# Patient Record
Sex: Male | Born: 1985 | Race: Black or African American | Hispanic: No | Marital: Single | Smoking: Current every day smoker
Health system: Southern US, Community
[De-identification: ages and names within clinical notes are randomized; demographics above are authoritative.]

## PROBLEM LIST (undated history)

## (undated) DIAGNOSIS — F32A Depression, unspecified: Secondary | ICD-10-CM

## (undated) DIAGNOSIS — F329 Major depressive disorder, single episode, unspecified: Secondary | ICD-10-CM

## (undated) DIAGNOSIS — F419 Anxiety disorder, unspecified: Secondary | ICD-10-CM

## (undated) DIAGNOSIS — E785 Hyperlipidemia, unspecified: Secondary | ICD-10-CM

## (undated) DIAGNOSIS — I10 Essential (primary) hypertension: Secondary | ICD-10-CM

## (undated) DIAGNOSIS — K219 Gastro-esophageal reflux disease without esophagitis: Secondary | ICD-10-CM

## (undated) HISTORY — DX: Depression, unspecified: F32.A

## (undated) HISTORY — DX: Major depressive disorder, single episode, unspecified: F32.9

## (undated) HISTORY — DX: Anxiety disorder, unspecified: F41.9

## (undated) HISTORY — DX: Hyperlipidemia, unspecified: E78.5

## (undated) HISTORY — DX: Essential (primary) hypertension: I10

---

## 2004-07-02 ENCOUNTER — Emergency Department (HOSPITAL_COMMUNITY): Admission: EM | Admit: 2004-07-02 | Discharge: 2004-07-02 | Payer: Self-pay | Admitting: Emergency Medicine

## 2004-07-05 ENCOUNTER — Emergency Department (HOSPITAL_COMMUNITY): Admission: EM | Admit: 2004-07-05 | Discharge: 2004-07-05 | Payer: Self-pay | Admitting: Family Medicine

## 2006-07-09 ENCOUNTER — Emergency Department (HOSPITAL_COMMUNITY): Admission: EM | Admit: 2006-07-09 | Discharge: 2006-07-09 | Payer: Self-pay | Admitting: Family Medicine

## 2006-07-13 ENCOUNTER — Emergency Department (HOSPITAL_COMMUNITY): Admission: EM | Admit: 2006-07-13 | Discharge: 2006-07-13 | Payer: Self-pay | Admitting: Family Medicine

## 2006-07-28 ENCOUNTER — Emergency Department (HOSPITAL_COMMUNITY): Admission: EM | Admit: 2006-07-28 | Discharge: 2006-07-28 | Payer: Self-pay | Admitting: Emergency Medicine

## 2006-07-31 ENCOUNTER — Emergency Department (HOSPITAL_COMMUNITY): Admission: EM | Admit: 2006-07-31 | Discharge: 2006-07-31 | Payer: Self-pay | Admitting: Family Medicine

## 2014-07-25 ENCOUNTER — Ambulatory Visit (INDEPENDENT_AMBULATORY_CARE_PROVIDER_SITE_OTHER): Payer: Self-pay | Admitting: Physician Assistant

## 2014-07-25 VITALS — BP 134/70 | HR 109 | Temp 100.6°F | Resp 18 | Ht 74.0 in | Wt 213.0 lb

## 2014-07-25 DIAGNOSIS — R509 Fever, unspecified: Secondary | ICD-10-CM

## 2014-07-25 DIAGNOSIS — J069 Acute upper respiratory infection, unspecified: Secondary | ICD-10-CM

## 2014-07-25 LAB — POCT INFLUENZA A/B
INFLUENZA B, POC: NEGATIVE
Influenza A, POC: NEGATIVE

## 2014-07-25 MED ORDER — IBUPROFEN 600 MG PO TABS: 600.0000 mg | ORAL_TABLET | Freq: Three times a day (TID) | ORAL | Status: DC | PRN

## 2014-07-25 MED ORDER — BENZONATATE 100 MG PO CAPS: 100.0000 mg | ORAL_CAPSULE | Freq: Three times a day (TID) | ORAL | Status: DC | PRN

## 2014-07-25 MED ORDER — IBUPROFEN 200 MG PO TABS
400.0000 mg | ORAL_TABLET | Freq: Once | ORAL | Status: AC
  Administered 2014-07-25: 400 mg via ORAL

## 2014-07-25 NOTE — Progress Notes (Signed)
Subjective:    Patient ID: Calvin Robinson, male    DOB: [birth year], 29 y.o.   MRN: [id number]  HPI  Patient presents with fever, chills, myalgias, diarrhea, and cough that started this morning when he woke up. Has additionally been sweating, has decreased appetite, HA, sinus/ear pressure, and nausea. Had 5-6 loose stools. Sore throat is apparent with cough. Took Advil for HA this morning without relief. Denies h/o asthma or allergies. Smokes 1/2 pack/day. Had coworker that was sent home for being sick. NKDA.   Review of Systems  Constitutional: Positive for fever, chills, diaphoresis, appetite change and fatigue. Negative for activity change.  HENT: Positive for ear pain, rhinorrhea, sinus pressure and sore throat. Negative for congestion, ear discharge, postnasal drip and sneezing.   Eyes: Negative.   Respiratory: Positive for cough. Negative for chest tightness, shortness of breath and wheezing.   Cardiovascular: Negative for chest pain.  Gastrointestinal: Positive for nausea, abdominal pain and diarrhea. Negative for vomiting.  Musculoskeletal: Positive for myalgias.  Allergic/Immunologic: Negative for environmental allergies and food allergies.  Neurological: Positive for headaches. Negative for dizziness.  Hematological: Negative for adenopathy.       Objective:   Physical Exam  Constitutional: He is oriented to person, place, and time. He appears well-developed and well-nourished. He appears lethargic. He has a sickly appearance.  Blood pressure 134/70, pulse 109, temperature 100.6 F (38.1 C), temperature source Oral, resp. rate 18, height 6\' 2"  (1.88 m), weight 213 lb (96.616 kg), SpO2 100 %.  HENT:  Head: Normocephalic and atraumatic.  Right Ear: Tympanic membrane, external ear and ear canal normal.  Left Ear: Tympanic membrane, external ear and ear canal normal.  Nose: Rhinorrhea (with moderate erythema) present. No mucosal edema or sinus tenderness. Right sinus exhibits no  maxillary sinus tenderness and no frontal sinus tenderness. Left sinus exhibits no maxillary sinus tenderness and no frontal sinus tenderness.  Mouth/Throat: Uvula is midline and mucous membranes are normal. Posterior oropharyngeal erythema present. No oropharyngeal exudate, posterior oropharyngeal edema or tonsillar abscesses.  2+ hypertrophic tonsils bilat. Minimal erythema. No exudate.  Eyes: Conjunctivae are normal. Pupils are equal, round, and reactive to light. Right eye exhibits no discharge. Left eye exhibits no discharge. No scleral icterus.  Neck: Normal range of motion. Neck supple. No thyromegaly present.  Cardiovascular: Normal rate, regular rhythm and normal heart sounds.  Exam reveals no gallop and no friction rub.   No murmur heard. Pulmonary/Chest: Effort normal and breath sounds normal. No respiratory distress. He has no decreased breath sounds. He has no wheezes. He has no rhonchi. He has no rales.  Abdominal: Soft. Bowel sounds are normal. There is no tenderness.  Lymphadenopathy:    He has no cervical adenopathy.  Neurological: He is oriented to person, place, and time. He appears lethargic.  Skin: Skin is warm and dry. No rash noted. He is not diaphoretic. No erythema. No pallor.   Results for orders placed or performed in visit on 07/25/14  POCT Influenza A/B  Result Value Ref Range   Influenza A, POC Negative    Influenza B, POC Negative       Assessment & Plan:  1. Fever, unspecified fever cause - In office: ibuprofen (ADVIL,MOTRIN) tablet 400 mg; Take 2 tablets (400 mg total) by mouth once.  - POCT Influenza A/B - ibuprofen (ADVIL,MOTRIN) 600 MG tablet; Take 1 tablet (600 mg total) by mouth every 8 (eight) hours as needed.  Dispense: 30 tablet; Refill: 0 for fever and  myalgias.   2. Acute upper respiratory infection RTC if sx worsen.  - benzonatate (TESSALON) 100 MG capsule; Take 1-2 capsules (100-200 mg total) by mouth 3 (three) times daily as needed for cough.   Dispense: 40 capsule; Refill: 0 - Note written to return to work 07/28/14.    Alveta Heimlich PA-C  [facility] 07/25/2014 5:22 PM

## 2014-07-25 NOTE — Patient Instructions (Addendum)
Upper Respiratory Infection, Adult An upper respiratory infection (URI) is also sometimes known as the common cold. The upper respiratory tract includes the nose, sinuses, throat, trachea, and bronchi. Bronchi are the airways leading to the lungs. Most people improve within 1 week, but symptoms can last up to 2 weeks. A residual cough may last even longer.  CAUSES Many different viruses can infect the tissues lining the upper respiratory tract. The tissues become irritated and inflamed and often become very moist. Mucus production is also common. A cold is contagious. You can easily spread the virus to others by oral contact. This includes kissing, sharing a glass, coughing, or sneezing. Touching your mouth or nose and then touching a surface, which is then touched by another person, can also spread the virus. SYMPTOMS  Symptoms typically develop 1 to 3 days after you come in contact with a cold virus. Symptoms vary from person to person. They may include:  Runny nose.  Sneezing.  Nasal congestion.  Sinus irritation.  Sore throat.  Loss of voice (laryngitis).  Cough.  Fatigue.  Muscle aches.  Loss of appetite.  Headache.  Low-grade fever. DIAGNOSIS  You might diagnose your own cold based on familiar symptoms, since most people get a cold 2 to 3 times a year. Your caregiver can confirm this based on your exam. Most importantly, your caregiver can check that your symptoms are not due to another disease such as strep throat, sinusitis, pneumonia, asthma, or epiglottitis. Blood tests, throat tests, and X-rays are not necessary to diagnose a common cold, but they may sometimes be helpful in excluding other more serious diseases. Your caregiver will decide if any further tests are required. RISKS AND COMPLICATIONS  You may be at risk for a more severe case of the common cold if you smoke cigarettes, have chronic heart disease (such as heart failure) or lung disease (such as asthma), or if  you have a weakened immune system. The very young and very old are also at risk for more serious infections. Bacterial sinusitis, middle ear infections, and bacterial pneumonia can complicate the common cold. The common cold can worsen asthma and chronic obstructive pulmonary disease (COPD). Sometimes, these complications can require emergency medical care and may be life-threatening. PREVENTION  The best way to protect against getting a cold is to practice good hygiene. Avoid oral or hand contact with people with cold symptoms. Wash your hands often if contact occurs. There is no clear evidence that vitamin C, vitamin E, echinacea, or exercise reduces the chance of developing a cold. However, it is always recommended to get plenty of rest and practice good nutrition. TREATMENT  Treatment is directed at relieving symptoms. There is no cure. Antibiotics are not effective, because the infection is caused by a virus, not by bacteria. Treatment may include:  Increased fluid intake. Sports drinks offer valuable electrolytes, sugars, and fluids.  Breathing heated mist or steam (vaporizer or shower).  Eating chicken soup or other clear broths, and maintaining good nutrition.  Getting plenty of rest.  Using gargles or lozenges for comfort.  Controlling fevers with ibuprofen or acetaminophen as directed by your caregiver.  Increasing usage of your inhaler if you have asthma. Zinc gel and zinc lozenges, taken in the first 24 hours of the common cold, can shorten the duration and lessen the severity of symptoms. Pain medicines may help with fever, muscle aches, and throat pain. A variety of non-prescription medicines are available to treat congestion and runny nose. Your caregiver   can make recommendations and may suggest nasal or lung inhalers for other symptoms.  HOME CARE INSTRUCTIONS   Only take over-the-counter or prescription medicines for pain, discomfort, or fever as directed by your  caregiver.  Use a warm mist humidifier or inhale steam from a shower to increase air moisture. This may keep secretions moist and make it easier to breathe.  Drink enough water and fluids to keep your urine clear or pale yellow.  Rest as needed.  Return to work when your temperature has returned to normal or as your caregiver advises. You may need to stay home longer to avoid infecting others. You can also use a face mask and careful hand washing to prevent spread of the virus. SEEK MEDICAL CARE IF:   After the first few days, you feel you are getting worse rather than better.  You need your caregiver's advice about medicines to control symptoms.  You develop chills, worsening shortness of breath, or brown or red sputum. These may be signs of pneumonia.  You develop yellow or brown nasal discharge or pain in the face, especially when you bend forward. These may be signs of sinusitis.  You develop a fever, swollen neck glands, pain with swallowing, or white areas in the back of your throat. These may be signs of strep throat. SEEK IMMEDIATE MEDICAL CARE IF:   You have a fever.  You develop severe or persistent headache, ear pain, sinus pain, or chest pain.  You develop wheezing, a prolonged cough, cough up blood, or have a change in your usual mucus (if you have chronic lung disease).  You develop sore muscles or a stiff neck. Document Released: 11/23/2000 Document Revised: 08/22/2011 Document Reviewed: 09/04/2013 [employer] Patient Information 2015 [employer]. This information is not intended to replace advice given to you by your health care provider. Make sure you discuss any questions you have with your health care provider.  

## 2014-08-05 ENCOUNTER — Emergency Department (HOSPITAL_COMMUNITY)
Admission: EM | Admit: 2014-08-05 | Discharge: 2014-08-05 | Disposition: A | Discharge status: Home / Self Care | Payer: Self-pay | Attending: Emergency Medicine | Admitting: Emergency Medicine

## 2014-08-05 ENCOUNTER — Encounter (HOSPITAL_COMMUNITY): Payer: Self-pay | Admitting: Neurology

## 2014-08-05 ENCOUNTER — Emergency Department (HOSPITAL_COMMUNITY): Payer: Self-pay

## 2014-08-05 DIAGNOSIS — K219 Gastro-esophageal reflux disease without esophagitis: Secondary | ICD-10-CM | POA: Insufficient documentation

## 2014-08-05 DIAGNOSIS — Z720 Tobacco use: Secondary | ICD-10-CM | POA: Insufficient documentation

## 2014-08-05 DIAGNOSIS — Z79899 Other long term (current) drug therapy: Secondary | ICD-10-CM | POA: Insufficient documentation

## 2014-08-05 DIAGNOSIS — R079 Chest pain, unspecified: Secondary | ICD-10-CM | POA: Insufficient documentation

## 2014-08-05 DIAGNOSIS — I10 Essential (primary) hypertension: Secondary | ICD-10-CM | POA: Insufficient documentation

## 2014-08-05 HISTORY — DX: Gastro-esophageal reflux disease without esophagitis: K21.9

## 2014-08-05 LAB — BASIC METABOLIC PANEL
ANION GAP: 7 (ref 5–15)
BUN: 7 mg/dL (ref 6–23)
CO2: 28 mmol/L (ref 19–32)
Calcium: 9.5 mg/dL (ref 8.4–10.5)
Chloride: 106 mmol/L (ref 96–112)
Creatinine, Ser: 0.93 mg/dL (ref 0.50–1.35)
GFR calc Af Amer: >90 mL/min (ref >90)
GLUCOSE: 111 mg/dL — AB (ref 70–99)
POTASSIUM: 3.5 mmol/L (ref 3.5–5.1)
SODIUM: 141 mmol/L (ref 135–145)

## 2014-08-05 LAB — I-STAT TROPONIN, ED: TROPONIN I, POC: 0 ng/mL (ref 0.00–0.08)

## 2014-08-05 LAB — CBC
HEMATOCRIT: 45.9 % (ref 39.0–52.0)
Hemoglobin: 15.5 g/dL (ref 13.0–17.0)
MCH: 28.7 pg (ref 26.0–34.0)
MCHC: 33.8 g/dL (ref 30.0–36.0)
MCV: 85 fL (ref 78.0–100.0)
PLATELETS: 348 10*3/uL (ref 150–400)
RBC: 5.4 MIL/uL (ref 4.22–5.81)
RDW: 13.3 % (ref 11.5–15.5)
WBC: 6.8 10*3/uL (ref 4.0–10.5)

## 2014-08-05 MED ORDER — GI COCKTAIL ~~LOC~~
30.0000 mL | Freq: Once | ORAL | Status: AC
  Administered 2014-08-05: 30 mL via ORAL
  Filled 2014-08-05: qty 30

## 2014-08-05 NOTE — ED Notes (Signed)
Pt st's he was dx with bronchitis last week, has had cough.  Pt also st's he has acid reflux

## 2014-08-05 NOTE — ED Provider Notes (Signed)
CSN: [id number]     Arrival date & time 08/05/14  1537 History   First MD Initiated Contact with Patient 08/05/14 1633     Chief Complaint  Patient presents with  . Chest Pain     (Consider location/radiation/quality/duration/timing/severity/associated sxs/prior Treatment) HPI  Calvin Robinson is a 29 y.o. male with PMH of hypertension, acid reflux presenting with crampy mid and left-sided chest pain that is intermittent for the last week. Patient states this started after he had upper respiratory tract infection. Patient states the pain at times radiates into his left arm for the past 3 days. No nausea, vomiting, diaphoresis, shortness of breath. Patient states the pain is worse with movement as well as anxiety. Patient states is relieved with belching. Patient with history of hypertension, smoking with family history of heart attacks in their 33s. Patient without cardiac history. Pt denies history of DVT, PE, recent surgery or trauma, malignancy, hemoptysis, unilateral leg swelling or tenderness, immobilization.    Past Medical History  Diagnosis Date  . Hypertension   . GERD (gastroesophageal reflux disease)    History reviewed. No pertinent past surgical history. No family history on file. History  Substance Use Topics  . Smoking status: Current Every Day Smoker -- 0.50 packs/day    Types: Cigarettes  . Smokeless tobacco: Never Used  . Alcohol Use: 3.0 oz/week    5 Standard drinks or equivalent per week    Review of Systems 10 Systems reviewed and are negative for acute change except as noted in the HPI.    Allergies  Review of patient's allergies indicates no known allergies.  Home Medications   Prior to Admission medications   Medication Sig Start Date End Date Taking? Authorizing Provider  benzonatate (TESSALON) 100 MG capsule Take 1-2 capsules (100-200 mg total) by mouth 3 (three) times daily as needed for cough. 07/25/14   Tishira R Brewington, PA-C  esomeprazole  (NEXIUM) 40 MG capsule Take 40 mg by mouth daily at 12 noon.    Historical Provider, MD  ibuprofen (ADVIL,MOTRIN) 600 MG tablet Take 1 tablet (600 mg total) by mouth every 8 (eight) hours as needed. 07/25/14   Tishira R Brewington, PA-C  metoprolol tartrate (LOPRESSOR) 25 MG tablet Take 25 mg by mouth 2 (two) times daily.    Historical Provider, MD   BP 131/82 mmHg  Pulse 65  Temp(Src) 98.4 F (36.9 C)  Resp 20  Ht 6\' 2"  (7.90 m)  Wt 240 lb (96.616 kg)  BMI 27.34 kg/m2  SpO2 99% Physical Exam  Constitutional: He appears well-developed and well-nourished. No distress.  HENT:  Head: Normocephalic and atraumatic.  Eyes: Conjunctivae and EOM are normal. Right eye exhibits no discharge. Left eye exhibits no discharge.  Neck: No JVD present.  Cardiovascular: Normal rate and regular rhythm.   No peripheral edema, tenderness. No calf tenderness or palpable cord.  Pulmonary/Chest: Effort normal and breath sounds normal. No respiratory distress. He has no wheezes.  Abdominal: Soft. Bowel sounds are normal. He exhibits no distension. There is no tenderness.  Neurological: He is alert. He exhibits normal muscle tone. Coordination normal.  Skin: Skin is warm and dry. He is not diaphoretic.  Nursing note and vitals reviewed.   ED Course  Procedures (including critical care time) Labs Review Labs Reviewed  BASIC METABOLIC PANEL - Abnormal; Notable for the following:    Glucose, Bld 111 (*)    All other components within normal limits  CBC  I-STAT TROPOININ, ED  Imaging Review Dg Chest 2 View  08/05/2014   CLINICAL DATA:  Left-sided chest pain for 1 week.  EXAM: CHEST  2 VIEW  COMPARISON:  None.  FINDINGS: The heart size and mediastinal contours are within normal limits. Both lungs are clear. No pneumothorax or plural effusion is noted. The visualized skeletal structures are unremarkable.  IMPRESSION: No acute cardiopulmonary abnormality seen.   Electronically Signed   By: Marijo Conception,  M.D.   On: 08/05/2014 16:06     EKG Interpretation   Date/Time:  Tuesday August 05 2014 15:41:56 EST Ventricular Rate:  73 PR Interval:  140 QRS Duration: 98 QT Interval:  370 QTC Calculation: 407 R Axis:   100 Text Interpretation:  Normal sinus rhythm Rightward axis Borderline ECG  AGREE. NO STEMI Confirmed by Johnney Killian, MD, Jeannie Done [id number]) on 08/05/2014  6:10:41 PM      MDM   Final diagnoses:  Chest pain, unspecified chest pain type   Chest pain is not likely of cardiac or pulmonary etiology d/t presentation, PERC negative, low risk HEART score. VSS, no JVD or new murmur, RRR, breath sounds equal bilaterally, EKG without acute abnormalities, negative troponin and negative CXR. Pt has been advised to add zantac to his PPI and return to the ED if CP becomes exertional, associated with diaphoresis or nausea, radiates to left jaw/arm, worsens or becomes concerning in any way. Patient without a PCP. Patient to establish care and follow up with wellness center. Pt appears reliable for follow up and is agreeable to discharge.   Discussed return precautions with patient. Discussed all results and patient verbalizes understanding and agrees with plan.  Case has been discussed with Dr. Johnney Killian who agrees with the above plan and to discharge.      Pura Spice, PA-C 08/05/14 1823  Charlesetta Shanks, MD 08/06/14 [phone]

## 2014-08-05 NOTE — ED Notes (Signed)
Pt st's feels much better,  Pain has subsided.  St's he is ready to be discharged

## 2014-08-05 NOTE — Discharge Instructions (Signed)
Return to the emergency room with worsening of symptoms, new symptoms or with symptoms that are concerning , especially chest pain that feels like a pressure, spreads to left arm or jaw, worse with exertion, associated with nausea, vomiting, shortness of breath and/or sweating.  Start taking OTC zantac 150mg  daily. Please call your doctor for a followup appointment within 24-48 hours. When you talk to your doctor please let them know that you were seen in the emergency department and have them acquire all of your records so that they can discuss the findings with you and formulate a treatment plan to fully care for your new and ongoing problems. If you do not have a primary care provider please call the number above to establish care and follow up with the wellness center. Read below information and follow recommendations.   Chest Pain (Nonspecific) It is often hard to give a specific diagnosis for the cause of chest pain. There is always a chance that your pain could be related to something serious, such as a heart attack or a blood clot in the lungs. You need to follow up with your health care provider for further evaluation. CAUSES   Heartburn.  Pneumonia or bronchitis.  Anxiety or stress.  Inflammation around your heart (pericarditis) or lung (pleuritis or pleurisy).  A blood clot in the lung.  A collapsed lung (pneumothorax). It can develop suddenly on its own (spontaneous pneumothorax) or from trauma to the chest.  Shingles infection (herpes zoster virus). The chest wall is composed of bones, muscles, and cartilage. Any of these can be the source of the pain.  The bones can be bruised by injury.  The muscles or cartilage can be strained by coughing or overwork.  The cartilage can be affected by inflammation and become sore (costochondritis). DIAGNOSIS  Lab tests or other studies may be needed to find the cause of your pain. Your health care provider may have you take a test  called an ambulatory electrocardiogram (ECG). An ECG records your heartbeat patterns over a 24-hour period. You may also have other tests, such as:  Transthoracic echocardiogram (TTE). During echocardiography, sound waves are used to evaluate how blood flows through your heart.  Transesophageal echocardiogram (TEE).  Cardiac monitoring. This allows your health care provider to monitor your heart rate and rhythm in real time.  Holter monitor. This is a portable device that records your heartbeat and can help diagnose heart arrhythmias. It allows your health care provider to track your heart activity for several days, if needed.  Stress tests by exercise or by giving medicine that makes the heart beat faster. TREATMENT   Treatment depends on what may be causing your chest pain. Treatment may include:  Acid blockers for heartburn.  Anti-inflammatory medicine.  Pain medicine for inflammatory conditions.  Antibiotics if an infection is present.  You may be advised to change lifestyle habits. This includes stopping smoking and avoiding alcohol, caffeine, and chocolate.  You may be advised to keep your head raised (elevated) when sleeping. This reduces the chance of acid going backward from your stomach into your esophagus. Most of the time, nonspecific chest pain will improve within 2-3 days with rest and mild pain medicine.  HOME CARE INSTRUCTIONS   If antibiotics were prescribed, take them as directed. Finish them even if you start to feel better.  For the next few days, avoid physical activities that bring on chest pain. Continue physical activities as directed.  Do not use any tobacco products, including  cigarettes, chewing tobacco, or electronic cigarettes.  Avoid drinking alcohol.  Only take medicine as directed by your health care provider.  Follow your health care provider's suggestions for further testing if your chest pain does not go away.  Keep any follow-up  appointments you made. If you do not go to an appointment, you could develop lasting (chronic) problems with pain. If there is any problem keeping an appointment, call to reschedule. SEEK MEDICAL CARE IF:   Your chest pain does not go away, even after treatment.  You have a rash with blisters on your chest.  You have a fever. SEEK IMMEDIATE MEDICAL CARE IF:   You have increased chest pain or pain that spreads to your arm, neck, jaw, back, or abdomen.  You have shortness of breath.  You have an increasing cough, or you cough up blood.  You have severe back or abdominal pain.  You feel nauseous or vomit.  You have severe weakness.  You faint.  You have chills. This is an emergency. Do not wait to see if the pain will go away. Get medical help at once. Call your local emergency services (911 in [place] Do not drive yourself to the hospital. MAKE SURE YOU:   Understand these instructions.  Will watch your condition.  Will get help right away if you are not doing well or get worse. Document Released: 03/09/2005 Document Revised: 06/04/2013 Document Reviewed: 01/03/2008 [facility] Patient Information 2015 [place], [place]. This information is not intended to replace advice given to you by your health care provider. Make sure you discuss any questions you have with your health care provider.

## 2014-08-05 NOTE — ED Notes (Signed)
Pt reports left sided cp with radiation to left arm for 3 days. Denies n/v, denies SOB. Pt is tearful at triage. Is a x 4. Denies cardiac hx

## 2015-03-01 ENCOUNTER — Ambulatory Visit (INDEPENDENT_AMBULATORY_CARE_PROVIDER_SITE_OTHER): Payer: 59 | Admitting: Emergency Medicine

## 2015-03-01 VITALS — BP 130/82 | HR 85 | Temp 98.4°F | Resp 16 | Ht 74.0 in | Wt 209.5 lb

## 2015-03-01 DIAGNOSIS — K602 Anal fissure, unspecified: Secondary | ICD-10-CM

## 2015-03-01 MED ORDER — DOXYCYCLINE HYCLATE 100 MG PO TABS: 100.0000 mg | ORAL_TABLET | Freq: Two times a day (BID) | ORAL | Status: DC

## 2015-03-01 MED ORDER — LIDOCAINE (ANORECTAL) 5 % EX GEL
CUTANEOUS | Status: DC
Start: 2015-03-01 — End: 2017-12-07

## 2015-03-01 MED ORDER — LIDOCAINE (ANORECTAL) 5 % EX GEL: CUTANEOUS | Status: DC

## 2015-03-01 MED ORDER — DILTIAZEM GEL 2 %: 1.0000 "application " | Freq: Two times a day (BID) | CUTANEOUS | Status: DC

## 2015-03-01 NOTE — Patient Instructions (Signed)
Anal Fissure, Adult An anal fissure is a small tear or crack in the skin around the anus. Bleeding from a fissure usually stops on its own within a few minutes. However, bleeding will often reoccur with each bowel movement until the crack heals.  CAUSES   Passing large, hard stools.  Frequent diarrheal stools.  Constipation.  Inflammatory bowel disease (Crohn's disease or ulcerative colitis).  Infections.  Anal sex. SYMPTOMS   Small amounts of blood seen on your stools, on toilet paper, or in the toilet after a bowel movement.  Rectal bleeding.  Painful bowel movements.  Itching or irritation around the anus. DIAGNOSIS Your caregiver will examine the anal area. An anal fissure can usually be seen with careful inspection. A rectal exam may be performed and a short tube (anoscope) may be used to examine the anal canal. TREATMENT   You may be instructed to take fiber supplements. These supplements can soften your stool to help make bowel movements easier.  Sitz baths may be recommended to help heal the tear. Do not use soap in the sitz baths.  A medicated cream or ointment may be prescribed to lessen discomfort. HOME CARE INSTRUCTIONS   Maintain a diet high in fruits, whole grains, and vegetables. Avoid constipating foods like bananas and dairy products.  Take sitz baths as directed by your caregiver.  Drink enough fluids to keep your urine clear or pale yellow.  Only take over-the-counter or prescription medicines for pain, discomfort, or fever as directed by your caregiver. Do not take aspirin as this may increase bleeding.  Do not use ointments containing numbing medications (anesthetics) or hydrocortisone. They could slow healing. SEEK MEDICAL CARE IF:   Your fissure is not completely healed within 3 days.  You have further bleeding.  You have a fever.  You have diarrhea mixed with blood.  You have pain.  Your problem is getting worse rather than  better. MAKE SURE YOU:   Understand these instructions.  Will watch your condition.  Will get help right away if you are not doing well or get worse. Document Released: 05/30/2005 Document Revised: 08/22/2011 Document Reviewed: 11/14/2010 [employer] Patient Information 2015 [employer]. This information is not intended to replace advice given to you by your health care provider. Make sure you discuss any questions you have with your health care provider.  

## 2015-03-01 NOTE — Progress Notes (Signed)
This chart was scribed for Arlyss Queen, MD by Leandra Kern, Medical Scribe. This patient was seen in Room 11 and the patient's care was started at 1:11 PM.   Chief Complaint:  Chief Complaint  Patient presents with  . Hemorrhoids    x 2 wks.  using otc meds that have not helped.  did notice some blood that comes and goes    HPI: Calvin Robinson is a 29 y.o. male who reports to [facility] today complaining of hemorrhoids, onset last week.  Pt indicates his symptoms were accompanied by bleeding at onset and pain of the anal area. He reports that he used preparation H for the symptoms. He states that he did lift a heavy object (a TV) while presenting with the symptoms. Pt denies diarrhea.    Past Medical History  Diagnosis Date  . Hypertension   . GERD (gastroesophageal reflux disease)    History reviewed. No pertinent past surgical history. Social History   Social History  . Marital Status: Single    Spouse Name: N/A  . Number of Children: N/A  . Years of Education: N/A   Social History Main Topics  . Smoking status: Current Every Day Smoker -- 0.50 packs/day    Types: Cigarettes  . Smokeless tobacco: Never Used  . Alcohol Use: 3.0 oz/week    5 Standard drinks or equivalent per week  . Drug Use: No  . Sexual Activity: Not Asked   Other Topics Concern  . None   Social History Narrative   History reviewed. No pertinent family history. No Known Allergies Prior to Admission medications   Medication Sig Start Date End Date Taking? Authorizing Provider  ibuprofen (ADVIL,MOTRIN) 600 MG tablet Take 1 tablet (600 mg total) by mouth every 8 (eight) hours as needed. 07/25/14  Yes Tishira R Brewington, PA-C  metoprolol tartrate (LOPRESSOR) 25 MG tablet Take 25 mg by mouth 2 (two) times daily.   Yes Historical Provider, MD  omeprazole (PRILOSEC) 20 MG capsule Take 20 mg by mouth daily.   Yes Historical Provider, MD  benzonatate (TESSALON) 100 MG capsule Take 1-2 capsules (100-200  mg total) by mouth 3 (three) times daily as needed for cough. Patient not taking: Reported on 03/01/2015 07/25/14   Tishira R Brewington, PA-C     ROS: The patient has anal bleeding.  Pt denies diarrhea.   All other systems have been reviewed and were otherwise negative with the exception of those mentioned in the HPI and as above.    PHYSICAL EXAM: Filed Vitals:   03/01/15 1307  BP: 130/82  Pulse: 85  Temp: 98.4 F (36.9 C)  Resp: 16   Body mass index is 26.89 kg/(m^2).   General: Alert, no acute distress HEENT:  Normocephalic, atraumatic, oropharynx patent. Eye: Juliette [facility] Cardiovascular:  Regular rate and rhythm, no rubs murmurs or gallops.  No Carotid bruits, radial pulse intact. No pedal edema.  Respiratory: Clear to auscultation bilaterally.  No wheezes, rales, or rhonchi.  No cyanosis, no use of accessory musculature Abdominal: No organomegaly, abdomen is soft and non-tender, positive bowel sounds.  No masses. Musculoskeletal: Gait intact. No edema, tenderness Skin: No rashes. Neurologic: Facial musculature symmetric. Psychiatric: Patient acts appropriately throughout our interaction. Lymphatic: No cervical or submandibular lymphadenopathy Genitourinary/Anorectal: Anal fissure present at 2 o'clock. There is a 3 m mpyogenic granuloma in the area of his fissure. The area is tender to touch, and there is no obvious perirectal abscess.   LABS: Results for  orders placed or performed during the hospital encounter of 08/05/14  CBC  Result Value Ref Range   WBC 6.8 4.0 - 10.5 K/uL   RBC 5.40 4.22 - 5.81 MIL/uL   Hemoglobin 15.5 13.0 - 17.0 g/dL   HCT 45.9 39.0 - 52.0 %   MCV 85.0 78.0 - 100.0 fL   MCH 28.7 26.0 - 34.0 pg   MCHC 33.8 30.0 - 36.0 g/dL   RDW 13.3 11.5 - 15.5 %   Platelets 348 150 - 400 K/uL  Basic metabolic panel  Result Value Ref Range   Sodium 141 135 - 145 mmol/L   Potassium 3.5 3.5 - 5.1 mmol/L   Chloride 106 96 - 112 mmol/L   CO2 28 19 - 32  mmol/L   Glucose, Bld 111 (H) 70 - 99 mg/dL   BUN 7 6 - 23 mg/dL   Creatinine, Ser 0.93 0.50 - 1.35 mg/dL   Calcium 9.5 8.4 - 10.5 mg/dL   GFR calc non Af Amer >90 >90 mL/min   GFR calc Af Amer >90 >90 mL/min   Anion gap 7 5 - 15  I-stat troponin, ED (not at [facility])  Result Value Ref Range   Troponin i, poc 0.00 0.00 - 0.08 ng/mL   Comment 3             EKG/XRAY:   Primary read interpreted by Dr. Everlene Farrier at [facility].   ASSESSMENT/PLAN: Patient has an anal fissure. Will treat with Cardizem gel and lidocaine gel. I also covered him with doxycycline with concerns he could be developing a perirectal abscess.  By signing my name below, I, Rawaa Al Rifaie, attest that this documentation has been prepared under the direction and in the presence of Arlyss Queen, MD.  Leandra Kern, Medical Scribe. 03/01/2015.  1:11 PM.  I personally performed the services described in this documentation, which was scribed in my presence. The recorded information has been reviewed and is accurate.  Gross sideeffects, risk and benefits, and alternatives of medications d/w patient. Patient is aware that all medications have potential sideeffects and we are unable to predict every sideeffect or drug-drug interaction that may occur.  Arlyss Queen MD 03/01/2015 1:10 PM

## 2015-03-01 NOTE — Addendum Note (Signed)
Addended by: Frutoso Chase A on: 03/01/2015 01:55 PM   Modules accepted: Orders

## 2015-06-11 ENCOUNTER — Encounter (HOSPITAL_COMMUNITY): Payer: Self-pay | Admitting: *Deleted

## 2015-06-11 ENCOUNTER — Emergency Department (HOSPITAL_COMMUNITY): Payer: 59

## 2015-06-11 ENCOUNTER — Emergency Department (HOSPITAL_COMMUNITY)
Admission: EM | Admit: 2015-06-11 | Discharge: 2015-06-11 | Discharge status: Against Medical Advice | Payer: 59 | Attending: Emergency Medicine | Admitting: Emergency Medicine

## 2015-06-11 DIAGNOSIS — R05 Cough: Secondary | ICD-10-CM | POA: Diagnosis not present

## 2015-06-11 DIAGNOSIS — R079 Chest pain, unspecified: Secondary | ICD-10-CM | POA: Diagnosis not present

## 2015-06-11 DIAGNOSIS — F17210 Nicotine dependence, cigarettes, uncomplicated: Secondary | ICD-10-CM | POA: Insufficient documentation

## 2015-06-11 DIAGNOSIS — I10 Essential (primary) hypertension: Secondary | ICD-10-CM | POA: Insufficient documentation

## 2015-06-11 LAB — BASIC METABOLIC PANEL
Anion gap: 11 (ref 5–15)
CALCIUM: 10 mg/dL (ref 8.9–10.3)
CO2: 23 mmol/L (ref 22–32)
Chloride: 106 mmol/L (ref 101–111)
Creatinine, Ser: 0.81 mg/dL (ref 0.61–1.24)
GFR calc Af Amer: >60 mL/min (ref >60)
GLUCOSE: 113 mg/dL — AB (ref 65–99)
Potassium: 4.1 mmol/L (ref 3.5–5.1)
SODIUM: 140 mmol/L (ref 135–145)

## 2015-06-11 LAB — CBC
HCT: 50.1 % (ref 39.0–52.0)
Hemoglobin: 17.4 g/dL — ABNORMAL HIGH (ref 13.0–17.0)
MCH: 30.2 pg (ref 26.0–34.0)
MCHC: 34.7 g/dL (ref 30.0–36.0)
MCV: 86.8 fL (ref 78.0–100.0)
PLATELETS: 279 10*3/uL (ref 150–400)
RBC: 5.77 MIL/uL (ref 4.22–5.81)
RDW: 13.4 % (ref 11.5–15.5)
WBC: 6.2 10*3/uL (ref 4.0–10.5)

## 2015-06-11 LAB — I-STAT TROPONIN, ED: TROPONIN I, POC: 0.01 ng/mL (ref 0.00–0.08)

## 2015-06-11 NOTE — ED Notes (Signed)
Pt reports mid chest tightness that started yesterday. Recent intermittent dry cough. No sob, denies nausea. ekg done at triage.

## 2015-06-16 ENCOUNTER — Ambulatory Visit: Payer: BLUE CROSS/BLUE SHIELD

## 2015-06-17 ENCOUNTER — Encounter (HOSPITAL_COMMUNITY): Payer: Self-pay | Admitting: Emergency Medicine

## 2015-06-17 ENCOUNTER — Emergency Department (HOSPITAL_COMMUNITY)
Admission: EM | Admit: 2015-06-17 | Discharge: 2015-06-17 | Disposition: A | Discharge status: Home / Self Care | Payer: BLUE CROSS/BLUE SHIELD | Attending: Emergency Medicine | Admitting: Emergency Medicine

## 2015-06-17 DIAGNOSIS — R05 Cough: Secondary | ICD-10-CM | POA: Diagnosis present

## 2015-06-17 DIAGNOSIS — K219 Gastro-esophageal reflux disease without esophagitis: Secondary | ICD-10-CM | POA: Diagnosis not present

## 2015-06-17 DIAGNOSIS — Z79899 Other long term (current) drug therapy: Secondary | ICD-10-CM | POA: Diagnosis not present

## 2015-06-17 DIAGNOSIS — Z792 Long term (current) use of antibiotics: Secondary | ICD-10-CM | POA: Insufficient documentation

## 2015-06-17 DIAGNOSIS — I10 Essential (primary) hypertension: Secondary | ICD-10-CM | POA: Diagnosis not present

## 2015-06-17 DIAGNOSIS — F17210 Nicotine dependence, cigarettes, uncomplicated: Secondary | ICD-10-CM | POA: Insufficient documentation

## 2015-06-17 DIAGNOSIS — J069 Acute upper respiratory infection, unspecified: Secondary | ICD-10-CM | POA: Insufficient documentation

## 2015-06-17 NOTE — ED Notes (Signed)
Patient was alert, oriented and stable upon discharge. RN went over AVS and patient had no further questions.  

## 2015-06-17 NOTE — ED Provider Notes (Signed)
CSN: [id number]     Arrival date & time 06/17/15  1503 History  By signing my name below, I, Hansel Feinstein, attest that this documentation has been prepared under the direction and in the presence of  [employer], PA-C. Electronically Signed: Hansel Feinstein, ED Scribe. 06/17/2015. 3:44 PM.    Chief Complaint  Patient presents with  . Cough  . Sore Throat  . Generalized Body Aches  . Chills   The history is provided by the patient. No language interpreter was used.    HPI Comments: Rafael Cogar is a 30 y.o. male with h/o HTN, GERD who presents to the Emergency Department complaining of moderate sore throat onset 4 days ago with associated dry cough, chest congestion, rhinorrhea. Nothing makes the problem better or worse. Pt states he has taken Dayquil with no relief. NKDA. He denies fever, chills, abdominal pain, nausea, emesis.  No other modifying factors.  Past Medical History  Diagnosis Date  . Hypertension   . GERD (gastroesophageal reflux disease)    No past surgical history on file. No family history on file. Social History  Substance Use Topics  . Smoking status: Current Every Day Smoker -- 0.50 packs/day    Types: Cigarettes  . Smokeless tobacco: Never Used  . Alcohol Use: 3.0 oz/week    5 Standard drinks or equivalent per week    Review of Systems A 10 point review of systems was completed and was negative except for pertinent positives and negatives as mentioned in the history of present illness.   Allergies  Review of patient's allergies indicates no known allergies.  Home Medications   Prior to Admission medications   Medication Sig Start Date End Date Taking? Authorizing Provider  benzonatate (TESSALON) 100 MG capsule Take 1-2 capsules (100-200 mg total) by mouth 3 (three) times daily as needed for cough. Patient not taking: Reported on 03/01/2015 07/25/14   Tishira R Brewington, PA-C  diltiazem 2 % GEL Apply 1 application topically 2 (two) times daily. Apply a  pea-sized amount to the affected area TID. 03/01/15   Darlyne Russian, MD  doxycycline (VIBRA-TABS) 100 MG tablet Take 1 tablet (100 mg total) by mouth 2 (two) times daily. 03/01/15   Darlyne Russian, MD  ibuprofen (ADVIL,MOTRIN) 600 MG tablet Take 1 tablet (600 mg total) by mouth every 8 (eight) hours as needed. 07/25/14   Tishira R Brewington, PA-C  Lidocaine, Anorectal, 5 % GEL Apply a pea-sized amount to the affected area 3 times daily. 03/01/15   Darlyne Russian, MD  metoprolol tartrate (LOPRESSOR) 25 MG tablet Take 25 mg by mouth 2 (two) times daily.    Historical Provider, MD  omeprazole (PRILOSEC) 20 MG capsule Take 20 mg by mouth daily.    Historical Provider, MD   BP 144/106 mmHg  Pulse 71  Temp(Src) 97.8 F (36.6 C) (Oral)  Resp 18  SpO2 100% Physical Exam  Constitutional: He is oriented to person, place, and time. He appears well-developed and well-nourished.  Awake, alert, nontoxic appearance.    HENT:  Head: Normocephalic and atraumatic.  Mouth/Throat: Posterior oropharyngeal erythema present. No oropharyngeal exudate.  Mildly erythematous posterior oropharynx with no tonsillar swelling. Some postnasal drip.   Eyes: Conjunctivae and EOM are normal. Pupils are equal, round, and reactive to light.  Neck: Normal range of motion. Neck supple.  No cervical adenopathy.   Cardiovascular: Normal rate, regular rhythm and normal heart sounds.  Exam reveals no gallop and no friction rub.  No murmur heard. Heart sounds normal. RRR.    Pulmonary/Chest: Effort normal and breath sounds normal. No respiratory distress. He has no wheezes. He has no rales.  Lungs CTA bilaterally.   Abdominal: He exhibits no distension.  Musculoskeletal: Normal range of motion.  Lymphadenopathy:    He has no cervical adenopathy.  Neurological: He is alert and oriented to person, place, and time.  Skin: Skin is warm and dry.  Psychiatric: He has a normal mood and affect. His behavior is normal.  Nursing note and  vitals reviewed.   ED Course  Procedures (including critical care time) DIAGNOSTIC STUDIES: Oxygen Saturation is 100% on RA, normal by my interpretation.    COORDINATION OF CARE: 3:42 PM Discussed treatment plan with pt at bedside which includes symptomatic treatment and pt agreed to plan.   MDM   Final diagnoses:  None   Filed Vitals:   06/17/15 1510  BP: 144/106  Pulse: 71  Temp: 97.8 F (36.6 C)  Resp: 18    Meds given in ED:  Medications - No data to display  New Prescriptions   No medications on file     Pt symptoms consistent with URI. CXR not indicated. Pt will be discharged with symptomatic treatment.  Discussed return precautions.  Pt is hemodynamically stable & in NAD prior to discharge.   I personally performed the services described in this documentation, which was scribed in my presence. The recorded information has been reviewed and is accurate.   Comer Locket, PA-C 06/17/15 1817  Lacretia Leigh, MD 06/18/15 1540

## 2015-06-17 NOTE — Discharge Instructions (Signed)
There does not appear to be an emergent cause for your symptoms at this time. Your symptoms are likely due to a viral upper respiratory infection that will get better on its own. He may continue trying OTC symptom medications as we discussed. Follow up with your doctor as needed. Return to ED for any new or worsening symptoms.  Upper Respiratory Infection, Adult Most upper respiratory infections (URIs) are a viral infection of the air passages leading to the lungs. A URI affects the nose, throat, and upper air passages. The most common type of URI is nasopharyngitis and is typically referred to as "the common cold." URIs run their course and usually go away on their own. Most of the time, a URI does not require medical attention, but sometimes a bacterial infection in the upper airways can follow a viral infection. This is called a secondary infection. Sinus and middle ear infections are common types of secondary upper respiratory infections. Bacterial pneumonia can also complicate a URI. A URI can worsen asthma and chronic obstructive pulmonary disease (COPD). Sometimes, these complications can require emergency medical care and may be life threatening.  CAUSES Almost all URIs are caused by viruses. A virus is a type of germ and can spread from one person to another.  RISKS FACTORS You may be at risk for a URI if:   You smoke.   You have chronic heart or lung disease.  You have a weakened defense (immune) system.   You are very young or very old.   You have nasal allergies or asthma.  You work in crowded or poorly ventilated areas.  You work in health care facilities or schools. SIGNS AND SYMPTOMS  Symptoms typically develop 2-3 days after you come in contact with a cold virus. Most viral URIs last 7-10 days. However, viral URIs from the influenza virus (flu virus) can last 14-18 days and are typically more severe. Symptoms may include:   Runny or stuffy (congested) nose.    Sneezing.   Cough.   Sore throat.   Headache.   Fatigue.   Fever.   Loss of appetite.   Pain in your forehead, behind your eyes, and over your cheekbones (sinus pain).  Muscle aches.  DIAGNOSIS  Your health care provider may diagnose a URI by:  Physical exam.  Tests to check that your symptoms are not due to another condition such as:  Strep throat.  Sinusitis.  Pneumonia.  Asthma. TREATMENT  A URI goes away on its own with time. It cannot be cured with medicines, but medicines may be prescribed or recommended to relieve symptoms. Medicines may help:  Reduce your fever.  Reduce your cough.  Relieve nasal congestion. HOME CARE INSTRUCTIONS   Take medicines only as directed by your health care provider.   Gargle warm saltwater or take cough drops to comfort your throat as directed by your health care provider.  Use a warm mist humidifier or inhale steam from a shower to increase air moisture. This may make it easier to breathe.  Drink enough fluid to keep your urine clear or pale yellow.   Eat soups and other clear broths and maintain good nutrition.   Rest as needed.   Return to work when your temperature has returned to normal or as your health care provider advises. You may need to stay home longer to avoid infecting others. You can also use a face mask and careful hand washing to prevent spread of the virus.  Increase the usage  of your inhaler if you have asthma.   Do not use any tobacco products, including cigarettes, chewing tobacco, or electronic cigarettes. If you need help quitting, ask your health care provider. PREVENTION  The best way to protect yourself from getting a cold is to practice good hygiene.   Avoid oral or hand contact with people with cold symptoms.   Wash your hands often if contact occurs.  There is no clear evidence that vitamin C, vitamin E, echinacea, or exercise reduces the chance of developing a cold.  However, it is always recommended to get plenty of rest, exercise, and practice good nutrition.  SEEK MEDICAL CARE IF:   You are getting worse rather than better.   Your symptoms are not controlled by medicine.   You have chills.  You have worsening shortness of breath.  You have brown or red mucus.  You have yellow or brown nasal discharge.  You have pain in your face, especially when you bend forward.  You have a fever.  You have swollen neck glands.  You have pain while swallowing.  You have white areas in the back of your throat. SEEK IMMEDIATE MEDICAL CARE IF:   You have severe or persistent:  Headache.  Ear pain.  Sinus pain.  Chest pain.  You have chronic lung disease and any of the following:  Wheezing.  Prolonged cough.  Coughing up blood.  A change in your usual mucus.  You have a stiff neck.  You have changes in your:  Vision.  Hearing.  Thinking.  Mood. MAKE SURE YOU:   Understand these instructions.  Will watch your condition.  Will get help right away if you are not doing well or get worse.   This information is not intended to replace advice given to you by your health care provider. Make sure you discuss any questions you have with your health care provider.   Document Released: 11/23/2000 Document Revised: 10/14/2014 Document Reviewed: 09/04/2013 Elsevier Interactive Patient Education [employer].

## 2015-06-17 NOTE — ED Notes (Signed)
Pt states that he has had cough, nasal congestion, sore throat, gen body aches, and chills x 4 days.  Pt states that he also has a hx of anal fissure which hurts worse when he coughs.

## 2015-07-30 ENCOUNTER — Encounter: Payer: Self-pay | Admitting: Gastroenterology

## 2015-09-14 ENCOUNTER — Emergency Department (HOSPITAL_COMMUNITY)
Admission: EM | Admit: 2015-09-14 | Discharge: 2015-09-14 | Disposition: A | Discharge status: Home / Self Care | Payer: BLUE CROSS/BLUE SHIELD | Attending: Emergency Medicine | Admitting: Emergency Medicine

## 2015-09-14 ENCOUNTER — Encounter (HOSPITAL_COMMUNITY): Payer: Self-pay

## 2015-09-14 ENCOUNTER — Emergency Department (HOSPITAL_COMMUNITY): Payer: BLUE CROSS/BLUE SHIELD

## 2015-09-14 DIAGNOSIS — K219 Gastro-esophageal reflux disease without esophagitis: Secondary | ICD-10-CM | POA: Diagnosis not present

## 2015-09-14 DIAGNOSIS — R05 Cough: Secondary | ICD-10-CM | POA: Insufficient documentation

## 2015-09-14 DIAGNOSIS — R197 Diarrhea, unspecified: Secondary | ICD-10-CM | POA: Diagnosis not present

## 2015-09-14 DIAGNOSIS — R0789 Other chest pain: Secondary | ICD-10-CM

## 2015-09-14 DIAGNOSIS — R079 Chest pain, unspecified: Secondary | ICD-10-CM | POA: Diagnosis present

## 2015-09-14 DIAGNOSIS — Z79899 Other long term (current) drug therapy: Secondary | ICD-10-CM | POA: Diagnosis not present

## 2015-09-14 DIAGNOSIS — Z8659 Personal history of other mental and behavioral disorders: Secondary | ICD-10-CM | POA: Diagnosis not present

## 2015-09-14 DIAGNOSIS — I10 Essential (primary) hypertension: Secondary | ICD-10-CM | POA: Diagnosis not present

## 2015-09-14 DIAGNOSIS — F17210 Nicotine dependence, cigarettes, uncomplicated: Secondary | ICD-10-CM | POA: Diagnosis not present

## 2015-09-14 LAB — URINALYSIS, ROUTINE W REFLEX MICROSCOPIC
Bilirubin Urine: NEGATIVE
GLUCOSE, UA: NEGATIVE mg/dL
Hgb urine dipstick: NEGATIVE
Ketones, ur: 15 mg/dL — AB
LEUKOCYTES UA: NEGATIVE
Nitrite: NEGATIVE
PROTEIN: NEGATIVE mg/dL
Specific Gravity, Urine: 1.008 (ref 1.005–1.030)
pH: 5.5 (ref 5.0–8.0)

## 2015-09-14 LAB — CBC
HEMATOCRIT: 48.7 % (ref 39.0–52.0)
HEMOGLOBIN: 17.1 g/dL — AB (ref 13.0–17.0)
MCH: 30.5 pg (ref 26.0–34.0)
MCHC: 35.1 g/dL (ref 30.0–36.0)
MCV: 86.8 fL (ref 78.0–100.0)
Platelets: 266 10*3/uL (ref 150–400)
RBC: 5.61 MIL/uL (ref 4.22–5.81)
RDW: 13.8 % (ref 11.5–15.5)
WBC: 8.2 10*3/uL (ref 4.0–10.5)

## 2015-09-14 LAB — BASIC METABOLIC PANEL
ANION GAP: 13 (ref 5–15)
BUN: 7 mg/dL (ref 6–20)
CO2: 24 mmol/L (ref 22–32)
Calcium: 10.1 mg/dL (ref 8.9–10.3)
Chloride: 100 mmol/L — ABNORMAL LOW (ref 101–111)
Creatinine, Ser: 0.79 mg/dL (ref 0.61–1.24)
GFR calc Af Amer: >60 mL/min (ref >60)
GLUCOSE: 104 mg/dL — AB (ref 65–99)
POTASSIUM: 3.9 mmol/L (ref 3.5–5.1)
SODIUM: 137 mmol/L (ref 135–145)

## 2015-09-14 LAB — I-STAT TROPONIN, ED
TROPONIN I, POC: 0.01 ng/mL (ref 0.00–0.08)
Troponin i, poc: 0 ng/mL (ref 0.00–0.08)

## 2015-09-14 NOTE — ED Notes (Signed)
Critical labs reported to EDP [facility]

## 2015-09-14 NOTE — Discharge Instructions (Signed)
Continue taking Motrin and Tylenol as needed for pain control. Your pain does not seem to be due to a serious cause today, but return without fail for worsening symptoms, including pain with exertion, difficulty breathing, passing out, or any other symptoms concerning to you.  Chest Wall Pain Chest wall pain is pain in or around the bones and muscles of your chest. Sometimes, an injury causes this pain. Sometimes, the cause may not be known. This pain may take several weeks or longer to get better. HOME CARE INSTRUCTIONS  Pay attention to any changes in your symptoms. Take these actions to help with your pain:   Rest as told by your health care provider.   Avoid activities that cause pain. These include any activities that use your chest muscles or your abdominal and side muscles to lift heavy items.   If directed, apply ice to the painful area:  Put ice in a plastic bag.  Place a towel between your skin and the bag.  Leave the ice on for 20 minutes, 2-3 times per day.  Take over-the-counter and prescription medicines only as told by your health care provider.  Do not use tobacco products, including cigarettes, chewing tobacco, and e-cigarettes. If you need help quitting, ask your health care provider.  Keep all follow-up visits as told by your health care provider. This is important. SEEK MEDICAL CARE IF:  You have a fever.  Your chest pain becomes worse.  You have new symptoms. SEEK IMMEDIATE MEDICAL CARE IF:  You have nausea or vomiting.  You feel sweaty or light-headed.  You have a cough with phlegm (sputum) or you cough up blood.  You develop shortness of breath.   This information is not intended to replace advice given to you by your health care provider. Make sure you discuss any questions you have with your health care provider.   Document Released: 05/30/2005 Document Revised: 02/18/2015 Document Reviewed: 08/25/2014 Elsevier Interactive Patient Education  2016 [employer]  Nonspecific Chest Pain It is often hard to find the cause of chest pain. There is always a chance that your pain could be related to something serious, such as a heart attack or a blood clot in your lungs. Chest pain can also be caused by conditions that are not life-threatening. If you have chest pain, it is very important to follow up with your doctor.  HOME CARE  If you were prescribed an antibiotic medicine, finish it all even if you start to feel better.  Avoid any activities that cause chest pain.  Do not use any tobacco products, including cigarettes, chewing tobacco, or electronic cigarettes. If you need help quitting, ask your doctor.  Do not drink alcohol.  Take medicines only as told by your doctor.  Keep all follow-up visits as told by your doctor. This is important. This includes any further testing if your chest pain does not go away.  Your doctor may tell you to keep your head raised (elevated) while you sleep.  Make lifestyle changes as told by your doctor. These may include:  Getting regular exercise. Ask your doctor to suggest some activities that are safe for you.  Eating a heart-healthy diet. Your doctor or a diet specialist (dietitian) can help you to learn healthy eating options.  Maintaining a healthy weight.  Managing diabetes, if necessary.  Reducing stress. GET HELP IF:  Your chest pain does not go away, even after treatment.  You have a rash with blisters on your chest.  You have a fever. GET HELP RIGHT AWAY IF:  Your chest pain is worse.  You have an increasing cough, or you cough up blood.  You have severe belly (abdominal) pain.  You feel extremely weak.  You pass out (faint).  You have chills.  You have sudden, unexplained chest discomfort.  You have sudden, unexplained discomfort in your arms, back, neck, or jaw.  You have shortness of breath at any time.  You suddenly start to sweat, or your skin gets  clammy.  You feel nauseous.  You vomit.  You suddenly feel light-headed or dizzy.  Your heart begins to beat quickly, or it feels like it is skipping beats. These symptoms may be an emergency. Do not wait to see if the symptoms will go away. Get medical help right away. Call your local emergency services (911 in the [place] Do not drive yourself to the hospital.   This information is not intended to replace advice given to you by your health care provider. Make sure you discuss any questions you have with your health care provider.   Document Released: 11/16/2007 Document Revised: 06/20/2014 Document Reviewed: 01/03/2014 Elsevier Interactive Patient Education [employer].

## 2015-09-14 NOTE — ED Notes (Signed)
patient d/c'd self care.  F/U and pain control discussed.  Educated patient on the benefits of not smoking.  Patient verbalized understanding.

## 2015-09-14 NOTE — ED Provider Notes (Signed)
CSN: [id number]     Arrival date & time 09/14/15  1910 History   First MD Initiated Contact with Patient 09/14/15 2055     Chief Complaint  Patient presents with  . Chest Pain     (Consider location/radiation/quality/duration/timing/severity/associated sxs/prior Treatment) HPI 30 year old male who presents with chest pain. He has a history of hypertension and tobacco use. Prior history of hyperlipidemia, which she states has recently been resolved and does not require medication. States that he has recently felt unwell with mild cough productive of clear sputum and some diarrhea. State home from work today, and woke up from a nap with some right-sided chest discomfort. States that it is worse in certain positions, and not associated with nausea, vomiting, diaphoresis, shortness of breath, and not worsened with exertion. States that he has been doing a lot of pushups recently over the course of the past few days prior to onset of pain. No trauma. No lower extremity edema, orthopnea, PND, calf tenderness, recent immobilization, personal history of family history of PE/DVT. Past Medical History  Diagnosis Date  . Hypertension   . GERD (gastroesophageal reflux disease)   . Hyperlipidemia   . Anxiety and depression    History reviewed. No pertinent past surgical history. History reviewed. No pertinent family history. Social History  Substance Use Topics  . Smoking status: Current Every Day Smoker -- 0.50 packs/day    Types: Cigarettes  . Smokeless tobacco: Never Used  . Alcohol Use: 3.0 oz/week    5 Standard drinks or equivalent per week    Review of Systems 10/14 systems reviewed and are negative other than those stated in the HPI    Allergies  Benzonatate and Escitalopram  Home Medications   Prior to Admission medications   Medication Sig Start Date End Date Taking? Authorizing Provider  bismuth subsalicylate (PEPTO BISMOL) 262 MG chewable tablet Chew 524 mg by mouth as needed  for indigestion.   Yes Historical Provider, MD  ibuprofen (ADVIL,MOTRIN) 200 MG tablet Take 400 mg by mouth every 6 (six) hours as needed for headache or moderate pain.   Yes Historical Provider, MD  metoprolol tartrate (LOPRESSOR) 25 MG tablet Take 25 mg by mouth 2 (two) times daily.   Yes Historical Provider, MD  omeprazole (PRILOSEC) 20 MG capsule Take 20 mg by mouth daily.   Yes Historical Provider, MD  diltiazem 2 % GEL Apply 1 application topically 2 (two) times daily. Apply a pea-sized amount to the affected area TID. 03/01/15   Darlyne Russian, MD  Lidocaine, Anorectal, 5 % GEL Apply a pea-sized amount to the affected area 3 times daily. 03/01/15   Darlyne Russian, MD   BP 150/98 mmHg  Pulse 66  Temp(Src) 98.3 F (36.8 C) (Oral)  Resp 18  SpO2 100% Physical Exam Physical Exam  Nursing note and vitals reviewed. Constitutional: Well developed, well nourished, non-toxic, and in no acute distress Head: Normocephalic and atraumatic.  Mouth/Throat: Oropharynx is clear and moist.  Neck: Normal range of motion. Neck supple.  Cardiovascular: Normal rate and regular rhythm.   Pulmonary/Chest: Effort normal and breath sounds normal.  Abdominal: Soft. There is no tenderness. There is no rebound and no guarding.  Musculoskeletal: Normal range of motion.  Neurological: Alert, no facial droop, fluent speech, moves all extremities symmetrically Skin: Skin is warm and dry.  Psychiatric: Cooperative  ED Course  Procedures (including critical care time) Labs Review Labs Reviewed  CBC - Abnormal; Notable for the following:    Hemoglobin  17.1 (*)    All other components within normal limits  URINALYSIS, ROUTINE W REFLEX MICROSCOPIC (NOT AT [facility]) - Abnormal; Notable for the following:    APPearance CLOUDY (*)    Ketones, ur 15 (*)    All other components within normal limits  BASIC METABOLIC PANEL - Abnormal; Notable for the following:    Chloride 100 (*)    Glucose, Bld 104 (*)    All other  components within normal limits  I-STAT CHEM 8, ED - Abnormal; Notable for the following:    Sodium 132 (*)    Potassium 7.4 (*)    Chloride 100 (*)    Glucose, Bld 102 (*)    Calcium, Ion 0.98 (*)    Hemoglobin 19.0 (*)    HCT 56.0 (*)    All other components within normal limits  I-STAT TROPOININ, ED  Randolm Idol, ED    Imaging Review Dg Chest 2 View  09/14/2015  CLINICAL DATA:  Chest pain for 1 day EXAM: CHEST  2 VIEW COMPARISON:  June 11, 2015 FINDINGS: Lungs are clear. Heart size and pulmonary vascularity are normal. No adenopathy. No pneumothorax. No bone lesions. IMPRESSION: No edema or consolidation. Electronically Signed   By: Lowella Grip III M.D.   On: 09/14/2015 21:40   I have personally reviewed and evaluated these images and lab results as part of my medical decision-making.   EKG Interpretation   Date/Time:  Monday September 14 2015 19:28:57 EDT Ventricular Rate:  73 PR Interval:  146 QRS Duration: 90 QT Interval:  357 QTC Calculation: 393 R Axis:   97 Text Interpretation:  Sinus rhythm Atrial premature complex Borderline  right axis deviation ST elev, probable normal early repol pattern Benign  early repolarization No significant change was found Confirmed by Darly Massi MD,  Kendric Sindelar [id number]) on 09/14/2015 10:38:49 PM      MDM   Final diagnoses:  Atypical chest pain  Chest wall pain   30 year old male who presents with chest pain. Seems musculoskeletal in nature, as it is reproducible with twisting his torso and in the recent setting of doing multiple pushups. His vital signs are within normal limits. His EKG does not show any acute ischemic changes. Chest x-ray shows no acute cardiopulmonary processes. PERC negative. Serial troponins are negative and EKG repeated does not show dynamic changes. Remainder of blood work is unremarkable. Of note he did have documented potassium of 7.4, but this was on i-STAT likely hemolyzed. Repeat potassium on his BMP is normal,  no EKG changes. At this time, there does not appear to be a serious etiology of his symptoms. He will continue taking Tylenol and Motrin as needed at home. Heart score 2, and he will follow-up closely with his primary care doctor for reevaluation as needed. Strict return and follow-up instructions are reviewed. He expressed understanding of all discharge instructions and felt comfortable with the plan of care.  Forde Dandy, MD 09/14/15 2251

## 2015-09-14 NOTE — ED Notes (Signed)
Pt complains of right sided chest pain since about 3pm, he describes it as a squeezing pain

## 2015-09-16 LAB — I-STAT CHEM 8, ED
BUN: 7 mg/dL (ref 6–20)
CALCIUM ION: 0.98 mmol/L — AB (ref 1.12–1.23)
CHLORIDE: 100 mmol/L — AB (ref 101–111)
CREATININE: 0.8 mg/dL (ref 0.61–1.24)
Glucose, Bld: 102 mg/dL — ABNORMAL HIGH (ref 65–99)
HEMATOCRIT: 56 % — AB (ref 39.0–52.0)
Hemoglobin: 19 g/dL — ABNORMAL HIGH (ref 13.0–17.0)
Potassium: 7.4 mmol/L (ref 3.5–5.1)
Sodium: 132 mmol/L — ABNORMAL LOW (ref 135–145)
TCO2: 28 mmol/L (ref 0–100)

## 2015-09-21 ENCOUNTER — Ambulatory Visit: Payer: BLUE CROSS/BLUE SHIELD | Admitting: Gastroenterology

## 2015-11-30 ENCOUNTER — Encounter: Payer: Self-pay | Admitting: Gastroenterology

## 2015-11-30 ENCOUNTER — Ambulatory Visit (INDEPENDENT_AMBULATORY_CARE_PROVIDER_SITE_OTHER): Payer: BLUE CROSS/BLUE SHIELD | Admitting: Gastroenterology

## 2015-11-30 ENCOUNTER — Telehealth: Payer: Self-pay | Admitting: *Deleted

## 2015-11-30 VITALS — BP 120/90 | HR 68 | Ht 74.0 in | Wt 203.4 lb

## 2015-11-30 DIAGNOSIS — K921 Melena: Secondary | ICD-10-CM | POA: Diagnosis not present

## 2015-11-30 DIAGNOSIS — K6289 Other specified diseases of anus and rectum: Secondary | ICD-10-CM

## 2015-11-30 DIAGNOSIS — R197 Diarrhea, unspecified: Secondary | ICD-10-CM

## 2015-11-30 MED ORDER — DILTIAZEM GEL 2 %: 1.0000 "application " | Freq: Three times a day (TID) | CUTANEOUS | Status: DC

## 2015-11-30 NOTE — Patient Instructions (Signed)
We have sent the following medications to your pharmacy for you to pick up at your convenience:diltiazem gel.  RECTAL CARE INSTRUCTIONS:  1. Sitz Baths twice a day for 10 minutes each. 2. Thoroughly clean and dry the rectum. 3. Put Tucks pad against the rectum at night. 4. Clean the rectum with Balenol lotion after each bowel movement.  Normal BMI (Body Mass Index- based on height and weight) is between 19 and 25. Your BMI today is Body mass index is 26.1 kg/(m^2). Marland Kitchen Please consider follow up  regarding your BMI with your Primary Care Provider.  Thank you for choosing me and [facility].  Pricilla Riffle. Dagoberto Ligas., MD., Marval Regal

## 2015-11-30 NOTE — Progress Notes (Signed)
History of Present Illness: This is a 30 year old male referred by Katherina Mires, MD for the evaluation of anal/rectal pain, hematochezia and diarrhea. Patient relates a long history of loose urgent frequent bowel movements occurring 3-4 times daily. This pattern has not changed. For the past year he has had persistent problems with ongoing, daily anal/rectal pain. The symptoms are exacerbated by movement, particularly from a seated position to standing, and by bowel movements. He states he was diagnosed with an anal fissure around December 2016 and was treated with a course of diltiazem which caused worsening of his pain so he discontinue the medication. He notes occasional small amounts of bright red blood with bowel movements. No prior colonoscopy, sigmoidoscopy or gastroenterology evaluation. Denies weight loss, abdominal pain, constipation, change in stool caliber, melena, nausea, vomiting, dysphagia, reflux symptoms, chest pain.   Allergies  Allergen Reactions  . Benzonatate Other (See Comments)    Dizziness   . Escitalopram     Jittery    Outpatient Prescriptions Prior to Visit  Medication Sig Dispense Refill  . bismuth subsalicylate (PEPTO BISMOL) 262 MG chewable tablet Chew 524 mg by mouth as needed for indigestion.    Marland Kitchen diltiazem 2 % GEL Apply 1 application topically 2 (two) times daily. Apply a pea-sized amount to the affected area TID. 30 g 1  . ibuprofen (ADVIL,MOTRIN) 200 MG tablet Take 400 mg by mouth every 6 (six) hours as needed for headache or moderate pain.    . Lidocaine, Anorectal, 5 % GEL Apply a pea-sized amount to the affected area 3 times daily. 30 g 1  . metoprolol tartrate (LOPRESSOR) 25 MG tablet Take 25 mg by mouth 2 (two) times daily.    Marland Kitchen omeprazole (PRILOSEC) 20 MG capsule Take 20 mg by mouth daily.     No facility-administered medications prior to visit.   Past Medical History  Diagnosis Date  . Hypertension   . GERD (gastroesophageal reflux disease)     . Hyperlipidemia   . Anxiety and depression    History reviewed. No pertinent past surgical history. Social History   Social History  . Marital Status: Single    Spouse Name: N/A  . Number of Children: N/A  . Years of Education: N/A   Social History Main Topics  . Smoking status: Current Every Day Smoker -- 0.50 packs/day    Types: Cigarettes  . Smokeless tobacco: Never Used  . Alcohol Use: 3.0 oz/week    5 Standard drinks or equivalent per week  . Drug Use: No  . Sexual Activity: Not Asked   Other Topics Concern  . None   Social History Narrative   Family History  Problem Relation Age of Onset  . Hypertension Father   . Colon cancer Neg Hx   . Diabetes Neg Hx     Review of Systems: Pertinent positive and negative review of systems were noted in the above HPI section. All other review of systems were otherwise negative.  Physical Exam: General: Well developed, well nourished, no acute distress Head: Normocephalic and atraumatic Eyes:  sclerae anicteric, EOMI Ears: Normal auditory acuity Mouth: No deformity or lesions Neck: Supple, no masses or thyromegaly Lungs: Clear throughout to auscultation Heart: Regular rate and rhythm; no murmurs, rubs or bruits Abdomen: Soft, non tender and non distended. No masses, hepatosplenomegaly or hernias noted. Normal Bowel sounds Rectal: Small external skin tag, no fissure visible. Exquisitely tender anal canal that did not allow for a DRE. Musculoskeletal: Symmetrical  with no gross deformities  Skin: No lesions on visible extremities Pulses:  Normal pulses noted Extremities: No clubbing, cyanosis, edema or deformities noted Neurological: Alert oriented x 4, grossly nonfocal Cervical Nodes:  No significant cervical adenopathy Inguinal Nodes: No significant inguinal adenopathy Psychological:  Alert and cooperative. Normal mood and affect  Assessment and Recommendations:  1. Anal pain, hematochezia, diarrhea. Anal fissure  strongly suspected. Diarrhea could be functional but need to exclude IBD and other causes. Avoid all lactose products for 1 week. Minimize caffeine use. If diarrhea improves with lactose avoidance continue lactose avoidance. Begin 2% diltiazem gel PR tid for 8 weeks and standard rectal care instructions. Tylenol or Advil for pain control. Return office visit in 6-8 weeks and will schedule colonoscopy at that time   cc: Katherina Mires, MD [address] [place], [place] [address]

## 2015-12-09 ENCOUNTER — Telehealth: Payer: Self-pay | Admitting: Gastroenterology

## 2015-12-09 MED ORDER — DILTIAZEM GEL 2 %: 1.0000 "application " | Freq: Three times a day (TID) | CUTANEOUS | Status: DC

## 2015-12-09 NOTE — Telephone Encounter (Signed)
Re-faxed the prescription of Diltiazem to [facility]. Left a message for patient notifying him.

## 2016-01-23 ENCOUNTER — Emergency Department (HOSPITAL_COMMUNITY)
Admission: EM | Admit: 2016-01-23 | Discharge: 2016-01-23 | Disposition: A | Discharge status: Home / Self Care | Payer: BLUE CROSS/BLUE SHIELD | Attending: Emergency Medicine | Admitting: Emergency Medicine

## 2016-01-23 ENCOUNTER — Emergency Department (HOSPITAL_COMMUNITY): Payer: BLUE CROSS/BLUE SHIELD

## 2016-01-23 ENCOUNTER — Encounter (HOSPITAL_COMMUNITY): Payer: Self-pay | Admitting: Emergency Medicine

## 2016-01-23 DIAGNOSIS — R197 Diarrhea, unspecified: Secondary | ICD-10-CM

## 2016-01-23 DIAGNOSIS — I10 Essential (primary) hypertension: Secondary | ICD-10-CM | POA: Diagnosis not present

## 2016-01-23 DIAGNOSIS — Z7289 Other problems related to lifestyle: Secondary | ICD-10-CM

## 2016-01-23 DIAGNOSIS — R109 Unspecified abdominal pain: Secondary | ICD-10-CM

## 2016-01-23 DIAGNOSIS — F1099 Alcohol use, unspecified with unspecified alcohol-induced disorder: Secondary | ICD-10-CM | POA: Insufficient documentation

## 2016-01-23 DIAGNOSIS — A059 Bacterial foodborne intoxication, unspecified: Secondary | ICD-10-CM | POA: Diagnosis not present

## 2016-01-23 DIAGNOSIS — R748 Abnormal levels of other serum enzymes: Secondary | ICD-10-CM | POA: Diagnosis not present

## 2016-01-23 DIAGNOSIS — R112 Nausea with vomiting, unspecified: Secondary | ICD-10-CM | POA: Diagnosis present

## 2016-01-23 DIAGNOSIS — F17210 Nicotine dependence, cigarettes, uncomplicated: Secondary | ICD-10-CM | POA: Diagnosis not present

## 2016-01-23 DIAGNOSIS — Z79899 Other long term (current) drug therapy: Secondary | ICD-10-CM | POA: Insufficient documentation

## 2016-01-23 DIAGNOSIS — Z789 Other specified health status: Secondary | ICD-10-CM

## 2016-01-23 DIAGNOSIS — Z791 Long term (current) use of non-steroidal anti-inflammatories (NSAID): Secondary | ICD-10-CM | POA: Diagnosis not present

## 2016-01-23 DIAGNOSIS — K529 Noninfective gastroenteritis and colitis, unspecified: Secondary | ICD-10-CM

## 2016-01-23 LAB — CBC WITH DIFFERENTIAL/PLATELET
BASOS PCT: 0 %
Basophils Absolute: 0 10*3/uL (ref 0.0–0.1)
EOS ABS: 0 10*3/uL (ref 0.0–0.7)
Eosinophils Relative: 1 %
HCT: 48.9 % (ref 39.0–52.0)
HEMOGLOBIN: 17.1 g/dL — AB (ref 13.0–17.0)
LYMPHS ABS: 1.6 10*3/uL (ref 0.7–4.0)
Lymphocytes Relative: 25 %
MCH: 30.1 pg (ref 26.0–34.0)
MCHC: 35 g/dL (ref 30.0–36.0)
MCV: 86.1 fL (ref 78.0–100.0)
Monocytes Absolute: 0.9 10*3/uL (ref 0.1–1.0)
Monocytes Relative: 14 %
NEUTROS ABS: 4 10*3/uL (ref 1.7–7.7)
NEUTROS PCT: 60 %
Platelets: 284 10*3/uL (ref 150–400)
RBC: 5.68 MIL/uL (ref 4.22–5.81)
RDW: 13.3 % (ref 11.5–15.5)
WBC: 6.6 10*3/uL (ref 4.0–10.5)

## 2016-01-23 LAB — URINALYSIS, ROUTINE W REFLEX MICROSCOPIC
BILIRUBIN URINE: NEGATIVE
Glucose, UA: NEGATIVE mg/dL
Hgb urine dipstick: NEGATIVE
Ketones, ur: NEGATIVE mg/dL
LEUKOCYTES UA: NEGATIVE
NITRITE: NEGATIVE
Protein, ur: NEGATIVE mg/dL
SPECIFIC GRAVITY, URINE: 1.019 (ref 1.005–1.030)
pH: 5.5 (ref 5.0–8.0)

## 2016-01-23 LAB — COMPREHENSIVE METABOLIC PANEL
ALBUMIN: 4.3 g/dL (ref 3.5–5.0)
ALT: 23 U/L (ref 17–63)
AST: 22 U/L (ref 15–41)
Alkaline Phosphatase: 53 U/L (ref 38–126)
Anion gap: 10 (ref 5–15)
BUN: <5 mg/dL — ABNORMAL LOW (ref 6–20)
CHLORIDE: 106 mmol/L (ref 101–111)
CO2: 22 mmol/L (ref 22–32)
CREATININE: 0.8 mg/dL (ref 0.61–1.24)
Calcium: 9.3 mg/dL (ref 8.9–10.3)
GFR calc non Af Amer: >60 mL/min (ref >60)
GLUCOSE: 93 mg/dL (ref 65–99)
Potassium: 3.5 mmol/L (ref 3.5–5.1)
SODIUM: 138 mmol/L (ref 135–145)
Total Bilirubin: 0.7 mg/dL (ref 0.3–1.2)
Total Protein: 8.1 g/dL (ref 6.5–8.1)

## 2016-01-23 LAB — LIPASE, BLOOD: LIPASE: 66 U/L — AB (ref 11–51)

## 2016-01-23 MED ORDER — DICYCLOMINE HCL 10 MG PO CAPS
10.0000 mg | ORAL_CAPSULE | Freq: Once | ORAL | Status: AC
  Administered 2016-01-23: 10 mg via ORAL
  Filled 2016-01-23: qty 1

## 2016-01-23 MED ORDER — SODIUM CHLORIDE 0.9 % IV BOLUS (SEPSIS)
1000.0000 mL | Freq: Once | INTRAVENOUS | Status: AC
  Administered 2016-01-23: 1000 mL via INTRAVENOUS

## 2016-01-23 MED ORDER — PROMETHAZINE HCL 25 MG PO TABS: 25.0000 mg | ORAL_TABLET | Freq: Four times a day (QID) | ORAL | 0 refills | Status: DC | PRN

## 2016-01-23 MED ORDER — DICYCLOMINE HCL 20 MG PO TABS: 20.0000 mg | ORAL_TABLET | Freq: Two times a day (BID) | ORAL | 0 refills | Status: DC | PRN

## 2016-01-23 MED ORDER — ONDANSETRON 8 MG PO TBDP: 8.0000 mg | ORAL_TABLET | Freq: Three times a day (TID) | ORAL | 0 refills | Status: DC | PRN

## 2016-01-23 MED ORDER — ONDANSETRON HCL 4 MG/2ML IJ SOLN
4.0000 mg | Freq: Once | INTRAMUSCULAR | Status: AC
  Administered 2016-01-23: 4 mg via INTRAVENOUS
  Filled 2016-01-23: qty 2

## 2016-01-23 NOTE — ED Notes (Signed)
RN will draw blood work

## 2016-01-23 NOTE — ED Triage Notes (Signed)
Patient presents for lower abdominal pain x3 days. Emesis x2 episodes in 24 hours, last normal BM yesterday. Denies fever or urinary symptoms. Seen by PCP 3 days ago, no relief with zofran.

## 2016-01-23 NOTE — Discharge Instructions (Signed)
Use zofran as prescribed, as needed for nausea (your tablet prescription can be chewed for same effect as the ODT form, or use the ODT form as instructed, but don't use BOTH together since it's the same medication). You can also use phenergan as directed for additional relief of nausea/vomiting (can be combined with zofran). Use bentyl as directed for abdominal cramping/diarrhea. Use tylenol or motrin as needed for pain. Stay well hydrated with small sips of fluids throughout the day. Follow a BRAT (banana-rice-applesauce-toast) diet as described below for the next 24-48 hours. The 'BRAT' diet is suggested, then progress to diet as tolerated as symptoms abate. Eat a clear liquid diet for the next 1-2 days to help calm down your pancreas, then advance to a low fat diet, in addition to the [employer]. Call your regular doctor if bloody stools, persistent diarrhea, vomiting, fever or abdominal pain. Your stool sample is in the lab, they will call you if anything abnormal shows up that requires further treatment. Follow up with your regular doctor and/or your gastroenterologist in 3-5 days for recheck of symptoms. Return to ER for changing or worsening of symptoms.

## 2016-01-23 NOTE — ED Provider Notes (Signed)
[facility] DEPT Provider Note   CSN: [id number] Arrival date & time: 01/23/16  1234  First Provider Contact:  First MD Initiated Contact with Patient 01/23/16 1248        History   Chief Complaint Chief Complaint  Patient presents with  . Abdominal Pain  . Emesis    HPI Calvin Robinson is a 30 y.o. male with a PMHx of anal fissure/chronic hematochezia and rectal pain, anxiety, depression, GERD, HTN, and HLD, who presents to the ED with complaints of gradual onset lower abdominal pain 3 days. He describes the pain is 9/10 intermittent cramping in the lower abdomen, nonradiating, worse with needing to have a bowel movement, movement, and Imodium use, and improved slightly after having a bowel movement. He was seen by his PCP Dr. Doreene Nest at [facility] and was told that he had a virus, given Zofran 8 mg tablets but this has not helped because he vomits it back up before it has a chance to absorb. Associated symptoms include nausea, 2 episodes of nonbloody nonbilious emesis in the last 24 hours, and 4 episodes of nonbloody watery diarrhea in the last 24 hours. +Sick contacts at work, +suspicious food intake, states the day before onset of symptoms he and his coworker ate [employer] and both individuals got sick. He admits to drinking approximately 2 beers per day, states that last night he was feeling somewhat better so he had one beer. He denies any fevers, chills, chest pain, shortness breath, hematemesis, melena, hematochezia, constipation, obstipation, dysuria, hematuria, testicular pain or swelling, penile discharge, numbness, tingling, focal weakness, recent travel, chronic NSAID use, or recent antibiotics. No prior abd surgeries.  Of note, he was seen by his GI doctor Dr. Fuller Plan at [facility] on 11/30/15 for anal pain related to an anal fissure and chronic diarrhea, was given diltiazem gel to use but Dr. Fuller Plan wants to r/o IBD as a cause so he would like to have colonoscopy done in 6-8  wks. Pt has not yet had the colonoscopy.   The history is provided by the patient and medical records. No language interpreter was used.  Abdominal Pain   This is a new problem. The current episode started more than 2 days ago. The problem occurs daily. The problem has not changed since onset.The pain is associated with suspicious food intake and sick contacts. The pain is located in the LLQ, RLQ and suprapubic region. The quality of the pain is cramping. The pain is at a severity of 9/10. The pain is moderate. Associated symptoms include diarrhea, nausea and vomiting. Pertinent negatives include fever, flatus, hematochezia, melena, constipation, dysuria, hematuria, arthralgias and myalgias. The symptoms are aggravated by activity (needing to have a BM, movement, and immodium use). The symptoms are relieved by bowel activity. His past medical history is significant for GERD.  Emesis   Associated symptoms include abdominal pain and diarrhea. Pertinent negatives include no arthralgias, no chills, no fever and no myalgias.    Past Medical History:  Diagnosis Date  . Anxiety and depression   . GERD (gastroesophageal reflux disease)   . Hyperlipidemia   . Hypertension     There are no active problems to display for this patient.   History reviewed. No pertinent surgical history.     Home Medications    Prior to Admission medications   Medication Sig Start Date End Date Taking? Authorizing Provider  bismuth subsalicylate (PEPTO BISMOL) 262 MG chewable tablet Chew 524 mg by mouth as needed for  indigestion.    Historical Provider, MD  diltiazem 2 % GEL Apply 1 application topically 3 (three) times daily. Apply a pea-sized amount to the affected area TID. 12/09/15   Ladene Artist, MD  ibuprofen (ADVIL,MOTRIN) 200 MG tablet Take 400 mg by mouth every 6 (six) hours as needed for headache or moderate pain.    Historical Provider, MD  Lidocaine, Anorectal, 5 % GEL Apply a pea-sized amount to the  affected area 3 times daily. 03/01/15   Darlyne Russian, MD  metoprolol tartrate (LOPRESSOR) 25 MG tablet Take 25 mg by mouth 2 (two) times daily.    Historical Provider, MD  omeprazole (PRILOSEC) 20 MG capsule Take 20 mg by mouth daily.    Historical Provider, MD    Family History Family History  Problem Relation Age of Onset  . Hypertension Father   . Colon cancer Neg Hx   . Diabetes Neg Hx     Social History Social History  Substance Use Topics  . Smoking status: Current Every Day Smoker    Packs/day: 0.50    Types: Cigarettes  . Smokeless tobacco: Never Used  . Alcohol use 3.0 oz/week    5 Standard drinks or equivalent per week     Allergies   Benzonatate and Escitalopram   Review of Systems Review of Systems  Constitutional: Negative for chills and fever.  Respiratory: Negative for shortness of breath.   Cardiovascular: Negative for chest pain.  Gastrointestinal: Positive for abdominal pain, diarrhea, nausea and vomiting. Negative for anal bleeding, blood in stool, constipation, flatus, hematochezia and melena.  Genitourinary: Negative for discharge, dysuria, hematuria, scrotal swelling and testicular pain.  Musculoskeletal: Negative for arthralgias and myalgias.  Skin: Negative for color change.  Allergic/Immunologic: Negative for immunocompromised state.  Neurological: Negative for weakness and numbness.  Psychiatric/Behavioral: Negative for confusion.   10 Systems reviewed and are negative for acute change except as noted in the HPI.   Physical Exam Updated Vital Signs BP (!) 144/103 (BP Location: Left Arm)   Pulse 79   Temp 99 F (37.2 C) (Oral)   Resp 17   Ht 6\' 2"  (1.88 m)   Wt 87.1 kg   SpO2 100%   BMI 24.65 kg/m   Physical Exam  Constitutional: He is oriented to person, place, and time. Vital signs are normal. He appears well-developed and well-nourished.  Non-toxic appearance. No distress.  Afebrile, nontoxic, NAD  HENT:  Head: Normocephalic and  atraumatic.  Mouth/Throat: Oropharynx is clear and moist and mucous membranes are normal.  Eyes: Conjunctivae and EOM are normal. Right eye exhibits no discharge. Left eye exhibits no discharge.  Neck: Normal range of motion. Neck supple.  Cardiovascular: Normal rate, regular rhythm, normal heart sounds and intact distal pulses.  Exam reveals no gallop and no friction rub.   No murmur heard. Pulmonary/Chest: Effort normal and breath sounds normal. No respiratory distress. He has no decreased breath sounds. He has no wheezes. He has no rhonchi. He has no rales.  Abdominal: Soft. Normal appearance and bowel sounds are normal. He exhibits no distension. There is generalized tenderness. There is no rigidity, no rebound, no guarding, no CVA tenderness, no tenderness at McBurney's point and negative Murphy's sign.  Soft, nondistended, +BS throughout, with mild generalized TTP without focal area of tenderness, no r/g/r, neg murphy's, neg mcburney's, no CVA TTP   Musculoskeletal: Normal range of motion.  Neurological: He is alert and oriented to person, place, and time. He has normal strength. No  sensory deficit.  Skin: Skin is warm, dry and intact. No rash noted.  Psychiatric: He has a normal mood and affect.  Nursing note and vitals reviewed.    ED Treatments / Results  Labs (all labs ordered are listed, but only abnormal results are displayed) Labs Reviewed  LIPASE, BLOOD - Abnormal; Notable for the following:       Result Value   Lipase 66 (*)    All other components within normal limits  COMPREHENSIVE METABOLIC PANEL - Abnormal; Notable for the following:    BUN <5 (*)    All other components within normal limits  URINALYSIS, ROUTINE W REFLEX MICROSCOPIC (NOT AT [facility]) - Abnormal; Notable for the following:    Color, Urine AMBER (*)    All other components within normal limits  CBC WITH DIFFERENTIAL/PLATELET - Abnormal; Notable for the following:    Hemoglobin 17.1 (*)    All other  components within normal limits  GASTROINTESTINAL PANEL BY PCR, STOOL (REPLACES STOOL CULTURE)    EKG  EKG Interpretation None       Radiology US Abdomen Complete  Result Date: 01/23/2016 CLINICAL DATA:  Patient with lower abdominal pain for 3 days. Vomiting. Elevated lipase. EXAM: ABDOMEN ULTRASOUND COMPLETE COMPARISON:  None. FINDINGS: Gallbladder: No gallstones or wall thickening visualized. No sonographic Murphy sign noted by sonographer. Common bile duct: Diameter: 3 mm Liver: No focal lesion identified. Within normal limits in parenchymal echogenicity. IVC: No abnormality visualized. Pancreas: Visualized portion unremarkable. Spleen: Size and appearance within normal limits. Right Kidney: Length: 11.2 cm. Echogenicity within normal limits. No mass or hydronephrosis visualized. Left Kidney: Length: 11.6 cm. Echogenicity within normal limits. No mass or hydronephrosis visualized. Abdominal aorta: No aneurysm visualized. Other findings: None. IMPRESSION: Unremarkable abdominal ultrasound. No cholelithiasis or sonographic evidence for acute cholecystitis. Electronically Signed   By: Lovey Newcomer M.D.   On: 01/23/2016 15:54    Procedures Procedures (including critical care time)  Medications Ordered in ED Medications  ondansetron (ZOFRAN) injection 4 mg (4 mg Intravenous Given 01/23/16 1324)  dicyclomine (BENTYL) capsule 10 mg (10 mg Oral Given 01/23/16 1325)  sodium chloride 0.9 % bolus 1,000 mL (0 mLs Intravenous Stopped 01/23/16 1517)     Initial Impression / Assessment and Plan / ED Course  I have reviewed the triage vital signs and the nursing notes.  Pertinent labs & imaging results that were available during my care of the patient were reviewed by me and considered in my medical decision making (see chart for details).  Clinical Course    30 y.o. male here with lower abd pain, n/v/d that began 3 days ago after he and a coworker ate subs from [employer], both people became  ill. He was seen by his PCP, told he had a virus, given zofran 8mg  tablets (NOT ODTs) and has used immodium but continues having symptoms. On exam, mild generalized tenderness in all quadrants, no specific area of tenderness, nonperitoneal. Afebrile and nontoxic. Doubt need for emergent imaging. Will get labs, stool sample, U/A, and give fluids, bentyl (requested capsule instead of IM), and zofran IV. Will reassess shortly  2:13 PM CBC w/diff WNL aside from mildly elevated Hgb similar to prior values. CMP WNL. Lipase marginally elevated at 66, will proceed with U/S to see if gallstones could be a cause, although pt has neg murphy's, but he's never had a lipase lab value in the past so hard to say if this is something new; does drink somewhat regularly so it  could just be from EtOH but he doesn't drink in excess. U/A pending. GI panel not yet done. Pt feeling better, nausea resolved, cramps improved. Will reassess after U/A and U/S.   4:33 PM U/A unremarkable. U/S without acute findings or gall stones. GI Panel in process. Pt feeling better, tolerating PO well. Overall symptoms c/w gastroenteritis from viral foodborne source. Doubt need for abx. Unlikely to actually be pancreatitis, but discussed alcohol cessation, clear liquid diet x1-2 days then advance to BRAT and low-fat diet. Discussed that he can use his home zofran tablets and just chew it, but will rx ODT form as well to make it easier if he wants to use those instead. Will also give phenergan so he has an alternative in case the zofran doesn't help initially. Will give bentyl rx as well. F/up with PCP and/or GI in 3-5 days. Lab will call if GI panel shows any need for further tx, discussed having his PCP also f/up with these results, but doubt need for waiting on these since it can take quite a bit of time to result. I explained the diagnosis and have given explicit precautions to return to the ER including for any other new or worsening symptoms. The  patient understands and accepts the medical plan as it's been dictated and I have answered their questions. Discharge instructions concerning home care and prescriptions have been given. The patient is STABLE and is discharged to home in good condition.    Final Clinical Impressions(s) / ED Diagnoses   Final diagnoses:  Nausea vomiting and diarrhea  Abdominal cramping  Gastroenteritis  Alcohol use (HCC)  Elevated lipase  Foodborne gastroenteritis    New Prescriptions New Prescriptions   DICYCLOMINE (BENTYL) 20 MG TABLET    Take 1 tablet (20 mg total) by mouth 2 (two) times daily as needed for spasms (abdominal cramping/diarrhea).   ONDANSETRON (ZOFRAN ODT) 8 MG DISINTEGRATING TABLET    Take 1 tablet (8 mg total) by mouth every 8 (eight) hours as needed for nausea or vomiting.   PROMETHAZINE (PHENERGAN) 25 MG TABLET    Take 1 tablet (25 mg total) by mouth every 6 (six) hours as needed for nausea or vomiting.     [address] [place], PA-C 01/23/16 1636    Margette Fast, MD 01/23/16 1742

## 2016-01-24 ENCOUNTER — Telehealth (HOSPITAL_BASED_OUTPATIENT_CLINIC_OR_DEPARTMENT_OTHER): Payer: Self-pay

## 2016-01-24 LAB — GASTROINTESTINAL PANEL BY PCR, STOOL (REPLACES STOOL CULTURE)
ADENOVIRUS F40/41: NOT DETECTED
ASTROVIRUS: NOT DETECTED
CAMPYLOBACTER SPECIES: DETECTED — AB
CYCLOSPORA CAYETANENSIS: NOT DETECTED
Cryptosporidium: NOT DETECTED
E. coli O157: NOT DETECTED
ENTEROAGGREGATIVE E COLI (EAEC): NOT DETECTED
ENTEROTOXIGENIC E COLI (ETEC): NOT DETECTED
Entamoeba histolytica: NOT DETECTED
Enteropathogenic E coli (EPEC): DETECTED — AB
GIARDIA LAMBLIA: NOT DETECTED
Norovirus GI/GII: NOT DETECTED
Plesimonas shigelloides: NOT DETECTED
Rotavirus A: NOT DETECTED
SAPOVIRUS (I, II, IV, AND V): NOT DETECTED
SHIGA LIKE TOXIN PRODUCING E COLI (STEC): NOT DETECTED
Salmonella species: NOT DETECTED
Shigella/Enteroinvasive E coli (EIEC): DETECTED — AB
VIBRIO CHOLERAE: NOT DETECTED
VIBRIO SPECIES: NOT DETECTED
Yersinia enterocolitica: NOT DETECTED

## 2016-01-24 NOTE — Telephone Encounter (Signed)
Gi panel results called from lab: + Camplobacter, + Enterotoxigenic E Coli, + Shigella.  Dr. Laverta Baltimore at [facility] called with results. Instructed Pt placement to contact Pt and have him to see his PCP. No medications ordered.

## 2016-01-25 ENCOUNTER — Telehealth (HOSPITAL_COMMUNITY): Payer: Self-pay

## 2016-01-25 ENCOUNTER — Ambulatory Visit: Payer: BLUE CROSS/BLUE SHIELD | Admitting: Gastroenterology

## 2016-01-25 NOTE — Telephone Encounter (Signed)
Unable to reach by phone.  Letter sent to [facility] address.

## 2016-03-14 ENCOUNTER — Encounter (INDEPENDENT_AMBULATORY_CARE_PROVIDER_SITE_OTHER): Payer: Self-pay

## 2016-03-14 ENCOUNTER — Encounter: Payer: Self-pay | Admitting: Gastroenterology

## 2016-03-14 ENCOUNTER — Ambulatory Visit (INDEPENDENT_AMBULATORY_CARE_PROVIDER_SITE_OTHER): Payer: BLUE CROSS/BLUE SHIELD | Admitting: Gastroenterology

## 2016-03-14 VITALS — BP 132/90 | HR 72 | Ht 74.0 in | Wt 194.8 lb

## 2016-03-14 DIAGNOSIS — K6289 Other specified diseases of anus and rectum: Secondary | ICD-10-CM | POA: Diagnosis not present

## 2016-03-14 DIAGNOSIS — K921 Melena: Secondary | ICD-10-CM

## 2016-03-14 DIAGNOSIS — R197 Diarrhea, unspecified: Secondary | ICD-10-CM | POA: Diagnosis not present

## 2016-03-14 MED ORDER — DILTIAZEM GEL 2 %: 1.0000 "application " | Freq: Three times a day (TID) | CUTANEOUS | 1 refills | Status: DC

## 2016-03-14 MED ORDER — NA SULFATE-K SULFATE-MG SULF 17.5-3.13-1.6 GM/177ML PO SOLN: 1.0000 | Freq: Once | ORAL | 0 refills | Status: AC

## 2016-03-14 NOTE — Patient Instructions (Signed)
We have sent the refill of diltiazem gel to [facility].   Start a lactose free diet x 1 week.   You have been scheduled for a colonoscopy. Please follow written instructions given to you at your visit today.  Please pick up your prep supplies at the pharmacy within the next 1-3 days. If you use inhalers (even only as needed), please bring them with you on the day of your procedure. Your physician has requested that you go to www.startemmi.com and enter the access code given to you at your visit today. This web site gives a general overview about your procedure. However, you should still follow specific instructions given to you by our office regarding your preparation for the procedure.  Thank you for choosing me and [facility].  Pricilla Riffle. Dagoberto Ligas., MD., Marval Regal

## 2016-03-14 NOTE — Progress Notes (Signed)
    History of Present Illness: This is a 30 year old male returning for follow-up of anal pain and hematochezia felt likely to be an anal fissure. He was treated with an 8 week course of diltiazem gel. His bleeding and anal pain have decreased but have not resolved. He has had chronic problems with urgent diarrhea which has improved. At this point he primarily notes anal pain and rectal bleeding associated with diarrhea. He has not tried a lactose free diet.   Current Medications, Allergies, Past Medical History, Past Surgical History, Family History and Social History were reviewed in [employer] record.  Physical Exam: General: Well developed, well nourished, no acute distress Head: Normocephalic and atraumatic Eyes:  sclerae anicteric, EOMI Ears: Normal auditory acuity Mouth: No deformity or lesions Lungs: Clear throughout to auscultation Heart: Regular rate and rhythm; no murmurs, rubs or bruits Abdomen: Soft, non tender and non distended. No masses, hepatosplenomegaly or hernias noted. Normal Bowel sounds Musculoskeletal: Symmetrical with no gross deformities  Pulses:  Normal pulses noted Extremities: No clubbing, cyanosis, edema or deformities noted Neurological: Alert oriented x 4, grossly nonfocal Psychological:  Alert and cooperative. Normal mood and affect  Assessment and Recommendations:  1. Hematochezia, anal pain, diarrhea. Suspected anal fissure or hemorrhoids. Rule out IBD and other disorders. 7 day lactose free diet trial. Refill diltiazem 2% gel pr tid. Continue Recticare tid prn. Schedule colonoscopy. The risks (including bleeding, perforation, infection, missed lesions, medication reactions and possible hospitalization or surgery if complications occur), benefits, and alternatives to colonoscopy with possible biopsy and possible polypectomy were discussed with the patient and they consent to proceed.

## 2016-03-18 ENCOUNTER — Encounter: Payer: Self-pay | Admitting: Gastroenterology

## 2016-03-29 ENCOUNTER — Telehealth: Payer: Self-pay

## 2016-03-29 ENCOUNTER — Encounter: Payer: Self-pay | Admitting: Gastroenterology

## 2016-03-29 ENCOUNTER — Telehealth: Payer: Self-pay | Admitting: Gastroenterology

## 2016-03-29 NOTE — Telephone Encounter (Signed)
Spoke with patient who agreed he would be able to pay $50 for his prep.  I called his pharmacy and had them run it successfully. Called patient and left a message that he could pick up his prep and it would be $50

## 2016-03-29 NOTE — Telephone Encounter (Signed)
Charge late cancellation fee 

## 2016-03-30 ENCOUNTER — Encounter: Payer: BLUE CROSS/BLUE SHIELD | Admitting: Gastroenterology

## 2016-03-30 ENCOUNTER — Telehealth (HOSPITAL_BASED_OUTPATIENT_CLINIC_OR_DEPARTMENT_OTHER): Payer: Self-pay | Admitting: Emergency Medicine

## 2016-03-30 NOTE — Telephone Encounter (Signed)
Lost to followup 

## 2016-04-01 NOTE — Telephone Encounter (Signed)
PT BILLED COLON CX FEE °

## 2016-04-21 ENCOUNTER — Encounter: Payer: Self-pay | Admitting: Gastroenterology

## 2016-04-21 ENCOUNTER — Other Ambulatory Visit: Payer: Self-pay | Admitting: *Deleted

## 2016-04-21 ENCOUNTER — Encounter: Payer: BLUE CROSS/BLUE SHIELD | Admitting: Gastroenterology

## 2016-04-21 ENCOUNTER — Telehealth: Payer: Self-pay | Admitting: Gastroenterology

## 2016-04-21 NOTE — Telephone Encounter (Signed)
Opened in error

## 2016-04-21 NOTE — Telephone Encounter (Signed)
yes charge

## 2016-06-17 ENCOUNTER — Ambulatory Visit (AMBULATORY_SURGERY_CENTER): Payer: Self-pay | Admitting: *Deleted

## 2016-06-17 VITALS — Ht 74.0 in | Wt 194.4 lb

## 2016-06-17 DIAGNOSIS — K921 Melena: Secondary | ICD-10-CM

## 2016-06-17 MED ORDER — NA SULFATE-K SULFATE-MG SULF 17.5-3.13-1.6 GM/177ML PO SOLN: ORAL | 0 refills | Status: DC

## 2016-06-17 NOTE — Progress Notes (Signed)
Pt denies allergies to eggs or soy products. Denies difficulty with sedation or anesthesia. Denies any diet or weight loss medications. Denies use of supplemental oxygen.  Emmi instructions given for procedure.  

## 2016-06-22 ENCOUNTER — Telehealth: Payer: Self-pay | Admitting: Gastroenterology

## 2016-06-22 NOTE — Telephone Encounter (Signed)
Patient contacted and offered a prep.  He declines.  I explained that he will be charged $100 for a late cancellation.  He says that is fine.

## 2016-06-22 NOTE — Telephone Encounter (Signed)
If needs a prep we give one for free or very inexpensive. If there is another issue let me know otherwise charge for late cancellation.

## 2016-06-23 ENCOUNTER — Encounter: Payer: BLUE CROSS/BLUE SHIELD | Admitting: Gastroenterology

## 2016-08-22 ENCOUNTER — Ambulatory Visit (AMBULATORY_SURGERY_CENTER): Payer: BLUE CROSS/BLUE SHIELD | Admitting: Gastroenterology

## 2016-08-22 ENCOUNTER — Encounter: Payer: Self-pay | Admitting: Gastroenterology

## 2016-08-22 VITALS — BP 124/68 | HR 74 | Temp 97.8°F | Resp 24 | Ht 74.0 in | Wt 194.0 lb

## 2016-08-22 DIAGNOSIS — D128 Benign neoplasm of rectum: Secondary | ICD-10-CM

## 2016-08-22 DIAGNOSIS — K621 Rectal polyp: Secondary | ICD-10-CM

## 2016-08-22 DIAGNOSIS — K921 Melena: Secondary | ICD-10-CM

## 2016-08-22 DIAGNOSIS — D122 Benign neoplasm of ascending colon: Secondary | ICD-10-CM

## 2016-08-22 MED ORDER — SODIUM CHLORIDE 0.9 % IV SOLN: 500.0000 mL | INTRAVENOUS | Status: AC

## 2016-08-22 NOTE — Patient Instructions (Signed)
**  Handouts given on polyps and hemorrhoids**   YOU HAD AN ENDOSCOPIC PROCEDURE TODAY: Refer to the procedure report and other information in the discharge instructions given to you for any specific questions about what was found during the examination. If this information does not answer your questions, please call [facility] office at [phone] to clarify.   YOU SHOULD EXPECT: Some feelings of bloating in the abdomen. Passage of more gas than usual. Walking can help get rid of the air that was put into your GI tract during the procedure and reduce the bloating. If you had a lower endoscopy (such as a colonoscopy or flexible sigmoidoscopy) you may notice spotting of blood in your stool or on the toilet paper. Some abdominal soreness may be present for a day or two, also.  DIET: Your first meal following the procedure should be a light meal and then it is ok to progress to your normal diet. A half-sandwich or bowl of soup is an example of a good first meal. Heavy or fried foods are harder to digest and may make you feel nauseous or bloated. Drink plenty of fluids but you should avoid alcoholic beverages for 24 hours. If you had a esophageal dilation, please see attached instructions for diet.    ACTIVITY: Your care partner should take you home directly after the procedure. You should plan to take it easy, moving slowly for the rest of the day. You can resume normal activity the day after the procedure however YOU SHOULD NOT DRIVE, use power tools, machinery or perform tasks that involve climbing or major physical exertion for 24 hours (because of the sedation medicines used during the test).   SYMPTOMS TO REPORT IMMEDIATELY: A gastroenterologist can be reached at any hour. Please call [phone]  for any of the following symptoms:  Following lower endoscopy (colonoscopy, flexible sigmoidoscopy) Excessive amounts of blood in the stool  Significant tenderness, worsening of abdominal pains  Swelling of  the abdomen that is new, acute  Fever of 100 or higher    FOLLOW UP:  If any biopsies were taken you will be contacted by phone or by letter within the next 1-3 weeks. Call [phone]  if you have not heard about the biopsies in 3 weeks.  Please also call with any specific questions about appointments or follow up tests.  

## 2016-08-22 NOTE — Progress Notes (Signed)
Called to room to assist during endoscopic procedure.  Patient ID and intended procedure confirmed with present staff. Received instructions for my participation in the procedure from the performing physician.  

## 2016-08-22 NOTE — Op Note (Signed)
[facility] Patient Name: Calvin Robinson Procedure Date: 08/22/2016 10:54 AM MRN: [id number] Endoscopist: Ladene Artist , MD Age: 31 Referring MD:  Date of Birth: [birth year] Gender: Male Account #: [id number] Procedure:                Colonoscopy Indications:              Hematochezia Medicines:                Monitored Anesthesia Care Procedure:                Pre-Anesthesia Assessment:                           - Prior to the procedure, a History and Physical                            was performed, and patient medications and                            allergies were reviewed. The patient's tolerance of                            previous anesthesia was also reviewed. The risks                            and benefits of the procedure and the sedation                            options and risks were discussed with the patient.                            All questions were answered, and informed consent                            was obtained. Prior Anticoagulants: The patient has                            taken no previous anticoagulant or antiplatelet                            agents. ASA Grade Assessment: II - A patient with                            mild systemic disease. After reviewing the risks                            and benefits, the patient was deemed in                            satisfactory condition to undergo the procedure.                           After obtaining informed consent, the colonoscope  was passed under direct vision. Throughout the                            procedure, the patient's blood pressure, pulse, and                            oxygen saturations were monitored continuously. The                            Colonoscope was introduced through the anus and                            advanced to the the cecum, identified by                            appendiceal orifice and ileocecal valve. The               ileocecal valve, appendiceal orifice, and rectum                            were photographed. The quality of the bowel                            preparation was good. The colonoscopy was performed                            without difficulty. The patient tolerated the                            procedure well. Scope In: 11:03:57 AM Scope Out: 16:10:96 AM Scope Withdrawal Time: 0 hours 10 minutes 42 seconds  Total Procedure Duration: 0 hours 13 minutes 0 seconds  Findings:                 The perianal and digital rectal examinations were                            normal.                           Two sessile polyps were found in the rectum and                            ascending colon. The polyps were 4 to 5 mm in size.                            These polyps were removed with a cold biopsy                            forceps. Resection and retrieval were complete.                           Internal hemorrhoids were found during  retroflexion. The hemorrhoids were small and Grade                            I (internal hemorrhoids that do not prolapse).                           Anal papilla(e) were hypertrophied. Biopsies were                            taken with a cold forceps for histology.                           The exam was otherwise without abnormality on                            direct and retroflexion views. Complications:            No immediate complications. Estimated blood loss:                            None. Estimated Blood Loss:     Estimated blood loss: none. Impression:               - Two 4 to 5 mm polyps in the rectum and in the                            ascending colon, removed with a cold biopsy                            forceps. Resected and retrieved.                           - Internal hemorrhoids.                           - Anal papilla(e) were hypertrophied. Biopsied.                           - The examination  was otherwise normal on direct                            and retroflexion views. Recommendation:           - Repeat colonoscopy in 5 years for surveillance if                            polyp(s) are precancerous, otherwise age 81 for                            routine screening.                           - Patient has a contact number available for                            emergencies. The signs and symptoms of potential  delayed complications were discussed with the                            patient. Return to normal activities tomorrow.                            Written discharge instructions were provided to the                            patient.                           - Resume previous diet.                           - Continue present medications.                           - Await pathology results. Ladene Artist, MD 08/22/2016 11:20:12 AM This report has been signed electronically.

## 2016-08-22 NOTE — Progress Notes (Signed)
Report given to PACU, vss 

## 2016-08-23 ENCOUNTER — Telehealth: Payer: Self-pay | Admitting: *Deleted

## 2016-08-23 NOTE — Telephone Encounter (Signed)
  Follow up Call-  Call back number 08/22/2016  Post procedure Call Back phone  # [phone]  Permission to leave phone message Yes  Some recent data might be hidden     Patient questions:  Do you have a fever, pain , or abdominal swelling? No. Pain Score  0 *  Have you tolerated food without any problems? Yes.    Have you been able to return to your normal activities? Yes.    Do you have any questions about your discharge instructions: Diet   No. Medications  No. Follow up visit  Yes.    Do you have questions or concerns about your Care? No.  Actions: * If pain score is 4 or above: No action needed, pain <4.

## 2016-08-23 NOTE — Telephone Encounter (Signed)
No answer. Message left to call if questions or concerns and that we would attempt to call one time later today.

## 2016-09-01 ENCOUNTER — Encounter: Payer: Self-pay | Admitting: Gastroenterology

## 2016-09-16 NOTE — Telephone Encounter (Signed)
PT BILLED COLON CX FEE

## 2016-09-22 ENCOUNTER — Emergency Department (HOSPITAL_COMMUNITY)
Admission: EM | Admit: 2016-09-22 | Discharge: 2016-09-22 | Disposition: A | Discharge status: Home / Self Care | Payer: BLUE CROSS/BLUE SHIELD | Attending: Emergency Medicine | Admitting: Emergency Medicine

## 2016-09-22 ENCOUNTER — Emergency Department (HOSPITAL_COMMUNITY): Payer: BLUE CROSS/BLUE SHIELD

## 2016-09-22 DIAGNOSIS — R079 Chest pain, unspecified: Secondary | ICD-10-CM | POA: Diagnosis not present

## 2016-09-22 DIAGNOSIS — I10 Essential (primary) hypertension: Secondary | ICD-10-CM | POA: Insufficient documentation

## 2016-09-22 DIAGNOSIS — R0602 Shortness of breath: Secondary | ICD-10-CM | POA: Diagnosis not present

## 2016-09-22 DIAGNOSIS — R072 Precordial pain: Secondary | ICD-10-CM | POA: Insufficient documentation

## 2016-09-22 DIAGNOSIS — F17210 Nicotine dependence, cigarettes, uncomplicated: Secondary | ICD-10-CM | POA: Insufficient documentation

## 2016-09-22 NOTE — ED Triage Notes (Signed)
Per registrar, pt told her that the pain he was experiencing was due to "gas."  Pt left the ED

## 2016-09-22 NOTE — ED Triage Notes (Signed)
Pt c/o right chest pain and tightness onset this morning, SOB, lightheadedness thought it was gas so self-treated with soda and belching, no relief.

## 2016-09-22 NOTE — ED Provider Notes (Signed)
[facility] DEPT Provider Note   CSN: [id number] Arrival date & time: 09/22/16  1621     History   Chief Complaint Chief Complaint  Patient presents with  . Chest Pain    HPI Calvin Robinson is a 31 y.o. male.  The history is provided by the patient.  Chest Pain   This is a new problem. The current episode started 1 to 2 hours ago. The problem occurs constantly. The problem has been resolved. The pain is associated with rest. The pain is present in the substernal region. The pain is mild. The quality of the pain is described as dull. The pain does not radiate. Pertinent negatives include no diaphoresis, no dizziness, no exertional chest pressure, no fever and no shortness of breath. Treatments tried: belching. The treatment provided significant relief. Risk factors include male gender.  His past medical history is significant for hyperlipidemia and hypertension.    Past Medical History:  Diagnosis Date  . Anxiety and depression   . GERD (gastroesophageal reflux disease)   . Hyperlipidemia   . Hypertension     There are no active problems to display for this patient.   No past surgical history on file.     Home Medications    Prior to Admission medications   Medication Sig Start Date End Date Taking? Authorizing Provider  bismuth subsalicylate (PEPTO BISMOL) 262 MG chewable tablet Chew 524 mg by mouth as needed for indigestion.    Historical Provider, MD  diltiazem 2 % GEL Apply 1 application topically 3 (three) times daily. Apply a pea-sized amount to the affected area TID. Patient not taking: Reported on 08/22/2016 03/14/16   Ladene Artist, MD  ibuprofen (ADVIL,MOTRIN) 200 MG tablet Take 400 mg by mouth every 6 (six) hours as needed for headache or moderate pain.    Historical Provider, MD  Lidocaine, Anorectal, 5 % GEL Apply a pea-sized amount to the affected area 3 times daily. Patient not taking: Reported on 06/17/2016 03/01/15   Darlyne Russian, MD  metoprolol  tartrate (LOPRESSOR) 25 MG tablet Take 25 mg by mouth 2 (two) times daily.    Historical Provider, MD  omeprazole (PRILOSEC) 20 MG capsule Take 20 mg by mouth daily as needed (acid reflux).     Historical Provider, MD  promethazine (PHENERGAN) 25 MG tablet Take 1 tablet (25 mg total) by mouth every 6 (six) hours as needed for nausea or vomiting. Patient not taking: Reported on 06/17/2016 01/23/16   Reece Agar, PA-C    Family History Family History  Problem Relation Age of Onset  . Hypertension Father   . Colon cancer Neg Hx   . Diabetes Neg Hx     Social History Social History  Substance Use Topics  . Smoking status: Current Every Day Smoker    Packs/day: 0.50    Types: Cigarettes  . Smokeless tobacco: Never Used  . Alcohol use 3.0 oz/week    5 Standard drinks or equivalent per week     Allergies   Benzonatate and Escitalopram   Review of Systems Review of Systems  Constitutional: Negative for diaphoresis and fever.  Respiratory: Negative for shortness of breath.   Cardiovascular: Positive for chest pain.  Neurological: Negative for dizziness.  All other systems reviewed and are negative.    Physical Exam Updated Vital Signs BP (!) 146/101 (BP Location: Left Arm)   Pulse 68   Temp 98.3 F (36.8 C) (Oral)   Resp 16   SpO2 100%  Physical Exam  Constitutional: He is oriented to person, place, and time. He appears well-developed and well-nourished. No distress.  HENT:  Head: Normocephalic and atraumatic.  Nose: Nose normal.  Eyes: Conjunctivae are normal.  Neck: Neck supple. No tracheal deviation present.  Cardiovascular: Normal rate, regular rhythm and normal heart sounds.   Pulmonary/Chest: Effort normal and breath sounds normal. No respiratory distress. He exhibits no tenderness.  Abdominal: Soft. He exhibits no distension. There is no tenderness.  Neurological: He is alert and oriented to person, place, and time.  Skin: Skin is warm and dry.    Psychiatric: He has a normal mood and affect.  Vitals reviewed.    ED Treatments / Results  Labs (all labs ordered are listed, but only abnormal results are displayed) Labs Reviewed - No data to display  EKG  EKG Interpretation  Date/Time:  Thursday September 22 2016 16:29:42 EDT Ventricular Rate:  83 PR Interval:    QRS Duration: 84 QT Interval:  350 QTC Calculation: 412 R Axis:   98 Text Interpretation:  Sinus rhythm Nonspecific T wave abnormality new in aVF Confirmed by Camyra Vaeth MD, Jencarlo Bonadonna [phone]) on 09/22/2016 7:22:05 PM       Radiology Dg Chest 2 View  Result Date: 09/22/2016 CLINICAL DATA:  RIGHT-sided chest pain, short of breath, lightheadedness EXAM: CHEST  2 VIEW COMPARISON:  09/14/2015 FINDINGS: Normal mediastinum and cardiac silhouette. Normal pulmonary vasculature. No evidence of effusion, infiltrate, or pneumothorax. No acute bony abnormality. IMPRESSION: No acute cardiopulmonary process. Electronically Signed   By: Suzy Bouchard M.D.   On: 09/22/2016 16:47    Procedures Procedures (including critical care time)  Medications Ordered in ED Medications - No data to display   Initial Impression / Assessment and Plan / ED Course  I have reviewed the triage vital signs and the nursing notes.  Pertinent labs & imaging results that were available during my care of the patient were reviewed by me and considered in my medical decision making (see chart for details).     31 year old male presents with chest pain that started just prior to arrival. He has GERD, hypertension, hyperlipidemia, and anxiety. He was in the waiting room noticing the pain across his chest then went to the bathroom and burped a few times and the pain went away completely. He is well-appearing here and has normal physical exam with nonspecific changes in aVF compared to prior EKGs but otherwise unremarkable. Chest x-rays negative. He does not have persistent pain here and he is asking to leave. He  has pending lab evaluation which I feel is not necessary given his young age, minimal risk factors, and highly atypical symptoms that improved with belching. Plan to follow up with PCP as needed and return precautions discussed for worsening or new concerning symptoms.   Final Clinical Impressions(s) / ED Diagnoses   Final diagnoses:  Chest pain, unspecified type    New Prescriptions New Prescriptions   No medications on file     Leo Grosser, MD 09/22/16 1934

## 2016-09-22 NOTE — ED Notes (Signed)
PT DISCHARGED. INSTRUCTIONS GIVEN. AAOX4. PT IN NO APPARENT DISTRESS OR PAIN. THE OPPORTUNITY TO ASK QUESTIONS WAS PROVIDED. 

## 2016-10-10 DIAGNOSIS — I10 Essential (primary) hypertension: Secondary | ICD-10-CM | POA: Diagnosis not present

## 2016-10-10 DIAGNOSIS — F419 Anxiety disorder, unspecified: Secondary | ICD-10-CM | POA: Diagnosis not present

## 2016-10-10 DIAGNOSIS — F329 Major depressive disorder, single episode, unspecified: Secondary | ICD-10-CM | POA: Diagnosis not present

## 2017-02-04 ENCOUNTER — Emergency Department (HOSPITAL_COMMUNITY): Payer: Self-pay

## 2017-02-04 ENCOUNTER — Emergency Department (HOSPITAL_COMMUNITY)
Admission: EM | Admit: 2017-02-04 | Discharge: 2017-02-04 | Disposition: A | Discharge status: Home / Self Care | Payer: Self-pay | Attending: Emergency Medicine | Admitting: Emergency Medicine

## 2017-02-04 ENCOUNTER — Encounter (HOSPITAL_COMMUNITY): Payer: Self-pay | Admitting: Emergency Medicine

## 2017-02-04 DIAGNOSIS — F17210 Nicotine dependence, cigarettes, uncomplicated: Secondary | ICD-10-CM | POA: Insufficient documentation

## 2017-02-04 DIAGNOSIS — I10 Essential (primary) hypertension: Secondary | ICD-10-CM | POA: Insufficient documentation

## 2017-02-04 DIAGNOSIS — Z79899 Other long term (current) drug therapy: Secondary | ICD-10-CM | POA: Insufficient documentation

## 2017-02-04 DIAGNOSIS — N50811 Right testicular pain: Secondary | ICD-10-CM

## 2017-02-04 DIAGNOSIS — H0012 Chalazion right lower eyelid: Secondary | ICD-10-CM

## 2017-02-04 LAB — URINALYSIS, ROUTINE W REFLEX MICROSCOPIC
BILIRUBIN URINE: NEGATIVE
Glucose, UA: NEGATIVE mg/dL
Ketones, ur: 15 mg/dL — AB
Leukocytes, UA: NEGATIVE
Nitrite: NEGATIVE
SPECIFIC GRAVITY, URINE: 1.03 (ref 1.005–1.030)
pH: 5.5 (ref 5.0–8.0)

## 2017-02-04 MED ORDER — ACETAMINOPHEN 325 MG PO TABS
650.0000 mg | ORAL_TABLET | Freq: Once | ORAL | Status: AC
  Administered 2017-02-04: 650 mg via ORAL
  Filled 2017-02-04: qty 2

## 2017-02-04 NOTE — Discharge Instructions (Signed)
Please read instructions below. Your ultrasound results were normal.  You can take advil every 6 hours as needed for pain. You can apply warm compresses to your eye to help the stye.  Schedule an appointment with your PCP as needed if symptoms persist. Return to the ER for fever, or new or concerning symptoms.

## 2017-02-04 NOTE — ED Notes (Signed)
Bed: WA21 Expected date:  Expected time:  Means of arrival:  Comments: 

## 2017-02-04 NOTE — ED Provider Notes (Signed)
[facility] DEPT Provider Note   CSN: [id number] Arrival date & time: 02/04/17  1331     History   Chief Complaint Chief Complaint  Patient presents with  . Testicle Pain    HPI Calvin Robinson is a 31 y.o. male with past medical history of hypertension, hyperlipidemia, GERD, presents to the ED with acute onset of right testicular pain that began immediately following sexual intercourse. Patient states right testicular pain radiates up into his right abdomen, worse with walking. He has not taken any medication for the pain. Denies urinary symptoms, penile pain, scrotal swelling or pain, history of STD, or any other complaints today. States he is sexually active with one male partner and does not use protection.  The history is provided by the patient.    Past Medical History:  Diagnosis Date  . Anxiety and depression   . GERD (gastroesophageal reflux disease)   . Hyperlipidemia   . Hypertension     There are no active problems to display for this patient.   History reviewed. No pertinent surgical history.     Home Medications    Prior to Admission medications   Medication Sig Start Date End Date Taking? Authorizing Provider  ibuprofen (ADVIL,MOTRIN) 200 MG tablet Take 400 mg by mouth every 6 (six) hours as needed for headache or moderate pain.   Yes [provider]  metoprolol tartrate (LOPRESSOR) 25 MG tablet Take 25 mg by mouth 2 (two) times daily.   Yes [provider]  omeprazole (PRILOSEC) 20 MG capsule Take 20 mg by mouth 2 (two) times daily before a meal.    Yes [provider]  diltiazem 2 % GEL Apply 1 application topically 3 (three) times daily. Apply a pea-sized amount to the affected area TID. Patient not taking: Reported on 08/22/2016 03/14/16   Ladene Artist, MD  Lidocaine, Anorectal, 5 % GEL Apply a pea-sized amount to the affected area 3 times daily. Patient not taking: Reported on 06/17/2016 03/01/15   Darlyne Russian, MD    promethazine (PHENERGAN) 25 MG tablet Take 1 tablet (25 mg total) by mouth every 6 (six) hours as needed for nausea or vomiting. Patient not taking: Reported on 06/17/2016 01/23/16   Street, [place], PA-C    Family History Family History  Problem Relation Age of Onset  . Hypertension Father   . Colon cancer Neg Hx   . Diabetes Neg Hx     Social History Social History  Substance Use Topics  . Smoking status: Current Every Day Smoker    Packs/day: 0.50    Types: Cigarettes  . Smokeless tobacco: Never Used  . Alcohol use 3.0 oz/week    5 Standard drinks or equivalent per week     Allergies   Benzonatate and Escitalopram   Review of Systems Review of Systems  Constitutional: Negative for fever.  Gastrointestinal: Positive for abdominal pain. Negative for nausea and vomiting.  Genitourinary: Positive for testicular pain. Negative for discharge, dysuria, frequency, penile pain, penile swelling and scrotal swelling.  Musculoskeletal: Negative for back pain.     Physical Exam Updated Vital Signs BP (!) 163/88 (BP Location: Right Arm) Comment: States he has not taken his BP meds   Pulse 89   Temp 98.6 F (37 C) (Oral)   Resp 14   SpO2 94%   Physical Exam  Constitutional: He appears well-developed and well-nourished. No distress.  HENT:  Head: Normocephalic and atraumatic.  Eyes: Pupils are equal, round, and reactive to  light. Conjunctivae and EOM are normal.  Right lower lateral lid with chalazion.   Cardiovascular: Normal rate and intact distal pulses.   Pulmonary/Chest: Effort normal.  Abdominal: Soft. Bowel sounds are normal. He exhibits no distension. There is no tenderness. There is no rebound and no guarding.  Genitourinary: Penis normal. Right testis shows tenderness. Right testis shows no mass and no swelling. Left testis shows no mass, no swelling and no tenderness. Circumcised. No penile erythema or penile tenderness. No discharge found.  Genitourinary  Comments: Exam performed with male chaperone present.  Psychiatric: He has a normal mood and affect. His behavior is normal.  Nursing note and vitals reviewed.  ED Treatments / Results  Labs (all labs ordered are listed, but only abnormal results are displayed) Labs Reviewed  URINALYSIS, ROUTINE W REFLEX MICROSCOPIC - Abnormal; Notable for the following:       Result Value   Hgb urine dipstick TRACE (*)    Ketones, ur 15 (*)    Protein, ur TRACE (*)    Bacteria, UA RARE (*)    Squamous Epithelial / LPF 0-5 (*)    All other components within normal limits    EKG  EKG Interpretation None       Radiology US Scrotum  Result Date: 02/04/2017 CLINICAL DATA:  Acute right testicular pain EXAM: SCROTAL ULTRASOUND DOPPLER ULTRASOUND OF THE TESTICLES TECHNIQUE: Complete ultrasound examination of the testicles, epididymis, and other scrotal structures was performed. Color and spectral Doppler ultrasound were also utilized to evaluate blood flow to the testicles. COMPARISON:  None. FINDINGS: Right testicle Measurements: 3.8 x 1.7 x 2.5 cm. No mass or microlithiasis visualized. Left testicle Measurements: 3.9 x 1.6 x 2.7 cm. No mass or microlithiasis visualized. Right epididymis:  Normal in size and appearance. Left epididymis:  Normal in size and appearance. Hydrocele:  None visualized. Varicocele:  None visualized. Pulsed Doppler interrogation of both testes demonstrates normal low resistance arterial and venous waveforms bilaterally. IMPRESSION: Normal study. Electronically Signed   By: Rolm Baptise M.D.   On: 02/04/2017 17:04   [place] Art/ven Flow Abd Pelv Doppler  Result Date: 02/04/2017 CLINICAL DATA:  Acute right testicular pain EXAM: SCROTAL ULTRASOUND DOPPLER ULTRASOUND OF THE TESTICLES TECHNIQUE: Complete ultrasound examination of the testicles, epididymis, and other scrotal structures was performed. Color and spectral Doppler ultrasound were also utilized to evaluate blood flow to the  testicles. COMPARISON:  None. FINDINGS: Right testicle Measurements: 3.8 x 1.7 x 2.5 cm. No mass or microlithiasis visualized. Left testicle Measurements: 3.9 x 1.6 x 2.7 cm. No mass or microlithiasis visualized. Right epididymis:  Normal in size and appearance. Left epididymis:  Normal in size and appearance. Hydrocele:  None visualized. Varicocele:  None visualized. Pulsed Doppler interrogation of both testes demonstrates normal low resistance arterial and venous waveforms bilaterally. IMPRESSION: Normal study. Electronically Signed   By: Rolm Baptise M.D.   On: 02/04/2017 17:04    Procedures Procedures (including critical care time)  Medications Ordered in ED Medications  acetaminophen (TYLENOL) tablet 650 mg (650 mg Oral Given 02/04/17 1548)     Initial Impression / Assessment and Plan / ED Course  I have reviewed the triage vital signs and the nursing notes.  Pertinent labs & imaging results that were available during my care of the patient were reviewed by me and considered in my medical decision making (see chart for details).    Patient presenting with acute onset of right testicular pain, without swelling or urinary symptoms. Ultrasound done showing  normal exam. Urine unremarkable. Pt declined STD testing. Symptomatic management and return precautions discussed. Pt is safe for discharge.  Patient discussed with and seen by Dr. Winfred Leeds.  Discussed results, findings, treatment and follow up. Patient advised of return precautions. Patient verbalized understanding and agreed with plan.  Final Clinical Impressions(s) / ED Diagnoses   Final diagnoses:  Right testicular pain  Chalazion of right lower eyelid    New Prescriptions Discharge Medication List as of 02/04/2017  5:13 PM       Russo, [place] N, PA-C 02/04/17 3953    Orlie Dakin, MD 02/05/17 1159

## 2017-02-04 NOTE — ED Provider Notes (Signed)
Patient developedright-sided testicular pain son onset [place] after sexual intercourse this morning. Pain has since improved without treatment. Presently is mild. No other associated symptoms.   Orlie Dakin, MD 02/04/17 [id number]

## 2017-02-04 NOTE — ED Triage Notes (Signed)
Pt reports R testicular pain that began this am after sex. Pt reports pain goes up into abd. Denies any swelling. Also wants to be seen for a sty on his R eye.

## 2017-06-27 DIAGNOSIS — F419 Anxiety disorder, unspecified: Secondary | ICD-10-CM | POA: Diagnosis not present

## 2017-06-27 DIAGNOSIS — I10 Essential (primary) hypertension: Secondary | ICD-10-CM | POA: Diagnosis not present

## 2017-06-27 DIAGNOSIS — R109 Unspecified abdominal pain: Secondary | ICD-10-CM | POA: Diagnosis not present

## 2017-06-27 DIAGNOSIS — K635 Polyp of colon: Secondary | ICD-10-CM | POA: Diagnosis not present

## 2017-06-27 DIAGNOSIS — F329 Major depressive disorder, single episode, unspecified: Secondary | ICD-10-CM | POA: Diagnosis not present

## 2017-09-19 ENCOUNTER — Emergency Department (HOSPITAL_COMMUNITY)
Admission: EM | Admit: 2017-09-19 | Discharge: 2017-09-20 | Discharge status: Against Medical Advice | Payer: Self-pay | Attending: Emergency Medicine | Admitting: Emergency Medicine

## 2017-09-19 ENCOUNTER — Encounter (HOSPITAL_COMMUNITY): Payer: Self-pay | Admitting: *Deleted

## 2017-09-19 DIAGNOSIS — Z5321 Procedure and treatment not carried out due to patient leaving prior to being seen by health care provider: Secondary | ICD-10-CM | POA: Insufficient documentation

## 2017-09-19 LAB — COMPREHENSIVE METABOLIC PANEL
ALT: 25 U/L (ref 17–63)
AST: 39 U/L (ref 15–41)
Albumin: 4.3 g/dL (ref 3.5–5.0)
Alkaline Phosphatase: 62 U/L (ref 38–126)
Anion gap: 12 (ref 5–15)
BUN: 5 mg/dL — AB (ref 6–20)
CALCIUM: 9.7 mg/dL (ref 8.9–10.3)
CO2: 25 mmol/L (ref 22–32)
Chloride: 104 mmol/L (ref 101–111)
Creatinine, Ser: 0.73 mg/dL (ref 0.61–1.24)
Glucose, Bld: 111 mg/dL — ABNORMAL HIGH (ref 65–99)
Potassium: 3.5 mmol/L (ref 3.5–5.1)
Sodium: 141 mmol/L (ref 135–145)
Total Bilirubin: 1.2 mg/dL (ref 0.3–1.2)
Total Protein: 8 g/dL (ref 6.5–8.1)

## 2017-09-19 LAB — LIPASE, BLOOD: Lipase: 34 U/L (ref 11–51)

## 2017-09-19 LAB — CBC
HCT: 47.8 % (ref 39.0–52.0)
Hemoglobin: 16.8 g/dL (ref 13.0–17.0)
MCH: 31.3 pg (ref 26.0–34.0)
MCHC: 35.1 g/dL (ref 30.0–36.0)
MCV: 89 fL (ref 78.0–100.0)
Platelets: 360 10*3/uL (ref 150–400)
RBC: 5.37 MIL/uL (ref 4.22–5.81)
RDW: 14.3 % (ref 11.5–15.5)
WBC: 7.3 10*3/uL (ref 4.0–10.5)

## 2017-09-19 NOTE — ED Triage Notes (Signed)
Pt complains of abd pain/emesis/diarrhea since last week. Pt states food makes him nauseas. Pt denies blood in stool.

## 2017-09-21 NOTE — ED Notes (Signed)
09/21/2017, Follow-up call completed

## 2017-12-07 ENCOUNTER — Emergency Department (HOSPITAL_COMMUNITY)
Admission: EM | Admit: 2017-12-07 | Discharge: 2017-12-07 | Disposition: A | Discharge status: Home / Self Care | Payer: BLUE CROSS/BLUE SHIELD | Attending: Emergency Medicine | Admitting: Emergency Medicine

## 2017-12-07 ENCOUNTER — Emergency Department (HOSPITAL_COMMUNITY): Payer: BLUE CROSS/BLUE SHIELD

## 2017-12-07 ENCOUNTER — Encounter (HOSPITAL_COMMUNITY): Payer: Self-pay | Admitting: Emergency Medicine

## 2017-12-07 DIAGNOSIS — Z79899 Other long term (current) drug therapy: Secondary | ICD-10-CM | POA: Insufficient documentation

## 2017-12-07 DIAGNOSIS — R0789 Other chest pain: Secondary | ICD-10-CM

## 2017-12-07 DIAGNOSIS — M25512 Pain in left shoulder: Secondary | ICD-10-CM | POA: Insufficient documentation

## 2017-12-07 DIAGNOSIS — F17210 Nicotine dependence, cigarettes, uncomplicated: Secondary | ICD-10-CM | POA: Diagnosis not present

## 2017-12-07 DIAGNOSIS — R079 Chest pain, unspecified: Secondary | ICD-10-CM | POA: Diagnosis not present

## 2017-12-07 DIAGNOSIS — I10 Essential (primary) hypertension: Secondary | ICD-10-CM | POA: Diagnosis not present

## 2017-12-07 LAB — CBC WITH DIFFERENTIAL/PLATELET
BASOS ABS: 0 10*3/uL (ref 0.0–0.1)
Basophils Relative: 1 %
EOS PCT: 2 %
Eosinophils Absolute: 0.1 10*3/uL (ref 0.0–0.7)
HCT: 47.6 % (ref 39.0–52.0)
Hemoglobin: 16.8 g/dL (ref 13.0–17.0)
LYMPHS PCT: 45 %
Lymphs Abs: 2.2 10*3/uL (ref 0.7–4.0)
MCH: 31.8 pg (ref 26.0–34.0)
MCHC: 35.3 g/dL (ref 30.0–36.0)
MCV: 90.2 fL (ref 78.0–100.0)
Monocytes Absolute: 0.6 10*3/uL (ref 0.1–1.0)
Monocytes Relative: 13 %
NEUTROS ABS: 1.9 10*3/uL (ref 1.7–7.7)
Neutrophils Relative %: 39 %
PLATELETS: 362 10*3/uL (ref 150–400)
RBC: 5.28 MIL/uL (ref 4.22–5.81)
RDW: 13.2 % (ref 11.5–15.5)
WBC: 4.8 10*3/uL (ref 4.0–10.5)

## 2017-12-07 LAB — BASIC METABOLIC PANEL
ANION GAP: 8 (ref 5–15)
BUN: 5 mg/dL — ABNORMAL LOW (ref 6–20)
CALCIUM: 9.5 mg/dL (ref 8.9–10.3)
CO2: 28 mmol/L (ref 22–32)
Chloride: 102 mmol/L (ref 98–111)
Creatinine, Ser: 0.76 mg/dL (ref 0.61–1.24)
GFR calc Af Amer: >60 mL/min (ref >60)
GFR calc non Af Amer: >60 mL/min (ref >60)
Glucose, Bld: 94 mg/dL (ref 70–99)
Potassium: 3.8 mmol/L (ref 3.5–5.1)
Sodium: 138 mmol/L (ref 135–145)

## 2017-12-07 LAB — I-STAT TROPONIN, ED: Troponin i, poc: 0 ng/mL (ref 0.00–0.08)

## 2017-12-07 MED ORDER — METHOCARBAMOL 500 MG PO TABS: 500.0000 mg | ORAL_TABLET | Freq: Two times a day (BID) | ORAL | 0 refills | Status: AC

## 2017-12-07 MED ORDER — IBUPROFEN 200 MG PO TABS
600.0000 mg | ORAL_TABLET | Freq: Once | ORAL | Status: AC
  Administered 2017-12-07: 600 mg via ORAL
  Filled 2017-12-07: qty 3

## 2017-12-07 NOTE — Discharge Instructions (Addendum)
Expect your soreness to increase over the next 2-3 days. Take it easy, but do not lay around too much as this may make any stiffness worse.  Antiinflammatory medications: Take 600 mg of ibuprofen every 6 hours or 440 mg (over the counter dose) to 500 mg (prescription dose) of naproxen every 12 hours for the next 3 days. After this time, these medications may be used as needed for pain. Take these medications with food to avoid upset stomach. Choose only one of these medications, do not take them together.  Tylenol: Should you continue to have additional pain while taking the ibuprofen or naproxen, you may add in tylenol as needed. Your daily total maximum amount of tylenol from all sources should be limited to 4000mg /day for persons without liver problems, or 2000mg /day for those with liver problems. Muscle relaxer: Robaxin is a muscle relaxer and may help loosen stiff muscles. Do not take the Robaxin while driving or performing other dangerous activities.  Exercises: Be sure to perform the attached exercises starting with three times a week and working up to performing them daily. This is an essential part of preventing long term problems.   Follow up with a primary care provider for any future management of these complaints.

## 2017-12-07 NOTE — ED Triage Notes (Signed)
Pt c/o left sided chest pains that started about hour ago with left arm tingling. Reports HX HTN and smoking.

## 2017-12-07 NOTE — ED Provider Notes (Signed)
[facility] DEPT Provider Note   CSN: [id number] Arrival date & time: 12/07/17  1103     History   Chief Complaint Chief Complaint  Patient presents with  . Chest Pain    HPI Calvin Robinson is a 32 y.o. male.  HPI   Calvin Robinson is a 32 y.o. male, with a history of GERD, hyperlipidemia, HTN, and anxiety, presenting to the ED with chest pain beginning around 10 AM this morning.  States he was sitting at rest at his desk when it started.  Pain is in the left chest, feels like a "tugging from the inside," 4/10, constant, radiating into the left arm.  States he has not experienced this before.  He also endorses a nonproductive cough over the last few days.  Denies fever/chills, dizziness, diaphoresis, shortness of breath, N/V/D, abdominal pain, trauma, or any other complaints.    Past Medical History:  Diagnosis Date  . Anxiety and depression   . GERD (gastroesophageal reflux disease)   . Hyperlipidemia   . Hypertension     There are no active problems to display for this patient.   History reviewed. No pertinent surgical history.      Home Medications    Prior to Admission medications   Medication Sig Start Date End Date Taking? Authorizing Provider  ibuprofen (ADVIL,MOTRIN) 200 MG tablet Take 400 mg by mouth every 6 (six) hours as needed for headache or moderate pain.   Yes [provider]  metoprolol tartrate (LOPRESSOR) 25 MG tablet Take 25 mg by mouth 2 (two) times daily.   Yes [provider]  omeprazole (PRILOSEC) 20 MG capsule Take 20 mg by mouth 2 (two) times daily before a meal.    Yes [provider]  methocarbamol (ROBAXIN) 500 MG tablet Take 1 tablet (500 mg total) by mouth 2 (two) times daily. 12/07/17   Josede Cicero, Helane Gunther, PA-C    Family History Family History  Problem Relation Age of Onset  . Hypertension Father   . Heart attack Father 60  . Heart failure Paternal Grandmother   . Colon cancer  Neg Hx   . Diabetes Neg Hx     Social History Social History   Tobacco Use  . Smoking status: Current Every Day Smoker    Packs/day: 0.50    Types: Cigarettes  . Smokeless tobacco: Never Used  Substance Use Topics  . Alcohol use: Yes    Alcohol/week: 3.0 oz    Types: 5 Standard drinks or equivalent per week  . Drug use: Yes    Frequency: 2.0 times per week    Types: Marijuana     Allergies   Benzonatate and Escitalopram   Review of Systems Review of Systems  Constitutional: Negative for chills, diaphoresis and fever.  Respiratory: Positive for cough. Negative for shortness of breath.   Cardiovascular: Positive for chest pain.  Gastrointestinal: Negative for abdominal pain, diarrhea, nausea and vomiting.  Neurological: Negative for dizziness, weakness and light-headedness.  All other systems reviewed and are negative.    Physical Exam Updated Vital Signs BP (!) 139/106 (BP Location: Right Arm)   Pulse 70   Temp 97.9 F (36.6 C) (Oral)   Resp 14   Ht 6\' 2"  (1.88 m)   Wt 90.7 kg (200 lb)   SpO2 100%   BMI 25.68 kg/m   Physical Exam  Constitutional: He appears well-developed and well-nourished. No distress.  HENT:  Head: Normocephalic and atraumatic.  Eyes: Conjunctivae are  normal.  Neck: Neck supple.  Cardiovascular: Normal rate, regular rhythm, normal heart sounds and intact distal pulses.  Pulses:      Radial pulses are 2+ on the right side, and 2+ on the left side.  Pulmonary/Chest: Effort normal and breath sounds normal. No respiratory distress.  Abdominal: Soft. There is no tenderness. There is no guarding.  Musculoskeletal: He exhibits no edema.       Left shoulder: He exhibits tenderness.       Arms: Pain with range of motion of the left shoulder.  No swelling, crepitus, instability, bruising, or deformity.  Lymphadenopathy:    He has no cervical adenopathy.  Neurological: He is alert.  Skin: Skin is warm and dry. He is not diaphoretic.    Psychiatric: He has a normal mood and affect. His behavior is normal.  Nursing note and vitals reviewed.    ED Treatments / Results  Labs (all labs ordered are listed, but only abnormal results are displayed) Labs Reviewed  BASIC METABOLIC PANEL - Abnormal; Notable for the following components:      Result Value   BUN 5 (*)    All other components within normal limits  CBC WITH DIFFERENTIAL/PLATELET  I-STAT TROPONIN, ED    EKG EKG Interpretation  Date/Time:  Thursday December 07 2017 11:10:19 EDT Ventricular Rate:  73 PR Interval:    QRS Duration: 89 QT Interval:  375 QTC Calculation: 414 R Axis:   93 Text Interpretation:  Sinus rhythm Borderline right axis deviation ST elev, probable normal early repol pattern No significant change since last tracing Confirmed by Duffy Bruce [phone]) on 12/07/2017 11:13:59 AM   Radiology Dg Chest 2 View  Result Date: 12/07/2017 CLINICAL DATA:  Left-sided chest pain EXAM: CHEST - 2 VIEW COMPARISON:  09/22/2016 FINDINGS: The heart size and mediastinal contours are within normal limits. Both lungs are clear. The visualized skeletal structures are unremarkable. IMPRESSION: No active cardiopulmonary disease. Electronically Signed   By: Inez Catalina M.D.   On: 12/07/2017 11:40    Procedures Procedures (including critical care time)  Medications Ordered in ED Medications  ibuprofen (ADVIL,MOTRIN) tablet 600 mg (has no administration in time range)     Initial Impression / Assessment and Plan / ED Course  I have reviewed the triage vital signs and the nursing notes.  Pertinent labs & imaging results that were available during my care of the patient were reviewed by me and considered in my medical decision making (see chart for details).  Clinical Course as of Dec 08 1335  Thu Dec 07, 2017  1302 Patient reassessed. Pain has improved.    [SJ]    Clinical Course User Index [SJ] Jazziel Fitzsimmons C, PA-C    Patient presents with complaint of  chest discomfort.  Pain reproducible with palpation and with range of motion of the left shoulder.  Pain is spontaneously resolved without intervention.  Low suspicion for ACS. HEART score is 3, indicating low risk for a cardiac event. PERC negative. Original plan was for delta troponins, however, patient states he does not want to stay for a second troponin.  Shared decision-making was used.  Return precautions discussed.  Patient voices understanding of these instructions, accepts the plan, and is comfortable with discharge.  Vitals:   12/07/17 1134 12/07/17 1200 12/07/17 1230 12/07/17 1300  BP: (!) 135/97 (!) 130/94 (!) 133/98 (!) 135/92  Pulse:  67 72 70  Resp: 12 17 13 16   Temp:      TempSrc:  SpO2:  100% 100% 100%  Weight:      Height:         Final Clinical Impressions(s) / ED Diagnoses   Final diagnoses:  Atypical chest pain  Acute pain of left shoulder    ED Discharge Orders        Ordered    methocarbamol (ROBAXIN) 500 MG tablet  2 times daily     12/07/17 Rheems, Areeba Sulser C, PA-C 12/07/17 1338    Duffy Bruce, MD 12/07/17 1624

## 2017-12-07 NOTE — ED Notes (Signed)
Istat trop = 0.00.

## 2017-12-25 DIAGNOSIS — K219 Gastro-esophageal reflux disease without esophagitis: Secondary | ICD-10-CM | POA: Diagnosis not present

## 2017-12-25 DIAGNOSIS — F17210 Nicotine dependence, cigarettes, uncomplicated: Secondary | ICD-10-CM | POA: Diagnosis not present

## 2017-12-25 DIAGNOSIS — M549 Dorsalgia, unspecified: Secondary | ICD-10-CM | POA: Diagnosis not present

## 2017-12-25 DIAGNOSIS — I10 Essential (primary) hypertension: Secondary | ICD-10-CM | POA: Diagnosis not present

## 2018-03-27 NOTE — Telephone Encounter (Signed)
error 

## 2018-04-06 DIAGNOSIS — L0231 Cutaneous abscess of buttock: Secondary | ICD-10-CM | POA: Diagnosis not present

## 2018-05-22 DIAGNOSIS — L988 Other specified disorders of the skin and subcutaneous tissue: Secondary | ICD-10-CM | POA: Diagnosis not present

## 2018-05-28 DIAGNOSIS — K610 Anal abscess: Secondary | ICD-10-CM | POA: Diagnosis not present

## 2018-11-14 DIAGNOSIS — R0781 Pleurodynia: Secondary | ICD-10-CM | POA: Diagnosis not present

## 2019-02-03 DIAGNOSIS — R51 Headache: Secondary | ICD-10-CM | POA: Diagnosis not present

## 2019-02-03 DIAGNOSIS — K5730 Diverticulosis of large intestine without perforation or abscess without bleeding: Secondary | ICD-10-CM | POA: Diagnosis not present

## 2019-02-03 DIAGNOSIS — K611 Rectal abscess: Secondary | ICD-10-CM | POA: Diagnosis not present

## 2019-02-03 DIAGNOSIS — R160 Hepatomegaly, not elsewhere classified: Secondary | ICD-10-CM | POA: Diagnosis not present

## 2019-02-03 DIAGNOSIS — R10819 Abdominal tenderness, unspecified site: Secondary | ICD-10-CM | POA: Diagnosis not present

## 2019-02-03 DIAGNOSIS — M545 Low back pain: Secondary | ICD-10-CM | POA: Diagnosis not present

## 2019-02-03 DIAGNOSIS — E876 Hypokalemia: Secondary | ICD-10-CM | POA: Diagnosis not present

## 2019-02-04 DIAGNOSIS — K602 Anal fissure, unspecified: Secondary | ICD-10-CM | POA: Diagnosis not present

## 2019-02-05 DIAGNOSIS — R51 Headache: Secondary | ICD-10-CM | POA: Diagnosis not present

## 2019-02-05 DIAGNOSIS — Z79899 Other long term (current) drug therapy: Secondary | ICD-10-CM | POA: Diagnosis not present

## 2019-02-05 DIAGNOSIS — I10 Essential (primary) hypertension: Secondary | ICD-10-CM | POA: Diagnosis not present

## 2019-02-12 DIAGNOSIS — R160 Hepatomegaly, not elsewhere classified: Secondary | ICD-10-CM | POA: Diagnosis not present

## 2019-02-12 DIAGNOSIS — R300 Dysuria: Secondary | ICD-10-CM | POA: Diagnosis not present

## 2019-02-12 DIAGNOSIS — R590 Localized enlarged lymph nodes: Secondary | ICD-10-CM | POA: Diagnosis not present

## 2019-02-12 DIAGNOSIS — I10 Essential (primary) hypertension: Secondary | ICD-10-CM | POA: Diagnosis not present

## 2019-02-12 DIAGNOSIS — Z820 Family history of epilepsy and other diseases of the nervous system: Secondary | ICD-10-CM | POA: Diagnosis not present

## 2019-02-12 DIAGNOSIS — M25473 Effusion, unspecified ankle: Secondary | ICD-10-CM | POA: Diagnosis not present

## 2019-02-22 DIAGNOSIS — K6289 Other specified diseases of anus and rectum: Secondary | ICD-10-CM | POA: Diagnosis not present

## 2019-02-25 DIAGNOSIS — R160 Hepatomegaly, not elsewhere classified: Secondary | ICD-10-CM | POA: Diagnosis not present

## 2019-02-25 DIAGNOSIS — I10 Essential (primary) hypertension: Secondary | ICD-10-CM | POA: Diagnosis not present

## 2019-02-25 DIAGNOSIS — R590 Localized enlarged lymph nodes: Secondary | ICD-10-CM | POA: Diagnosis not present

## 2019-02-28 DIAGNOSIS — K611 Rectal abscess: Secondary | ICD-10-CM | POA: Diagnosis not present

## 2019-02-28 DIAGNOSIS — R21 Rash and other nonspecific skin eruption: Secondary | ICD-10-CM | POA: Diagnosis not present

## 2019-02-28 DIAGNOSIS — L309 Dermatitis, unspecified: Secondary | ICD-10-CM | POA: Diagnosis not present

## 2019-03-18 DIAGNOSIS — K603 Anal fistula: Secondary | ICD-10-CM | POA: Diagnosis not present

## 2019-03-25 DIAGNOSIS — R112 Nausea with vomiting, unspecified: Secondary | ICD-10-CM | POA: Diagnosis not present

## 2019-03-25 DIAGNOSIS — E860 Dehydration: Secondary | ICD-10-CM | POA: Diagnosis not present

## 2019-03-25 DIAGNOSIS — R1084 Generalized abdominal pain: Secondary | ICD-10-CM | POA: Diagnosis not present

## 2019-04-02 DIAGNOSIS — K648 Other hemorrhoids: Secondary | ICD-10-CM | POA: Diagnosis not present

## 2019-04-02 DIAGNOSIS — K6289 Other specified diseases of anus and rectum: Secondary | ICD-10-CM | POA: Diagnosis not present

## 2019-04-02 DIAGNOSIS — K219 Gastro-esophageal reflux disease without esophagitis: Secondary | ICD-10-CM | POA: Diagnosis not present

## 2019-04-02 DIAGNOSIS — F17200 Nicotine dependence, unspecified, uncomplicated: Secondary | ICD-10-CM | POA: Diagnosis not present

## 2019-04-02 DIAGNOSIS — I10 Essential (primary) hypertension: Secondary | ICD-10-CM | POA: Diagnosis not present

## 2019-04-02 DIAGNOSIS — K603 Anal fistula: Secondary | ICD-10-CM | POA: Diagnosis not present

## 2019-04-12 DIAGNOSIS — M79672 Pain in left foot: Secondary | ICD-10-CM | POA: Diagnosis not present

## 2019-04-29 DIAGNOSIS — M79673 Pain in unspecified foot: Secondary | ICD-10-CM | POA: Diagnosis not present

## 2019-04-30 IMAGING — US US ART/VEN ABD/PELV/SCROTUM DOPPLER LTD
1 series · 14 of 25 positions shown · non-contrast
Comparison: None.

CLINICAL DATA: Acute right testicular pain

EXAM:
SCROTAL ULTRASOUND
DOPPLER ULTRASOUND OF THE TESTICLES
TECHNIQUE: Complete ultrasound examination of the testicles, epididymis, and
other scrotal structures was performed. Color and spectral Doppler
ultrasound were also utilized to evaluate blood flow to the
testicles.

[Series 1: us art/ven abd/pelv/scrotum doppler ltd · 0.06mm/px · 14 of 41 slices shown]
[im 1/41]
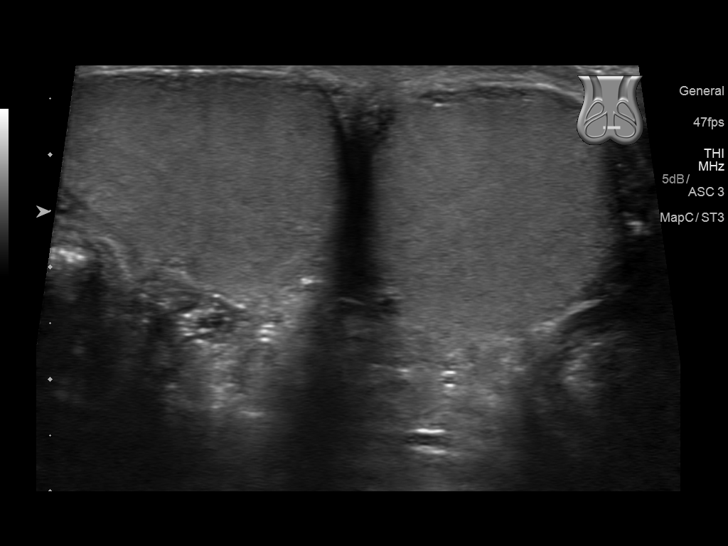
[im 4/41]
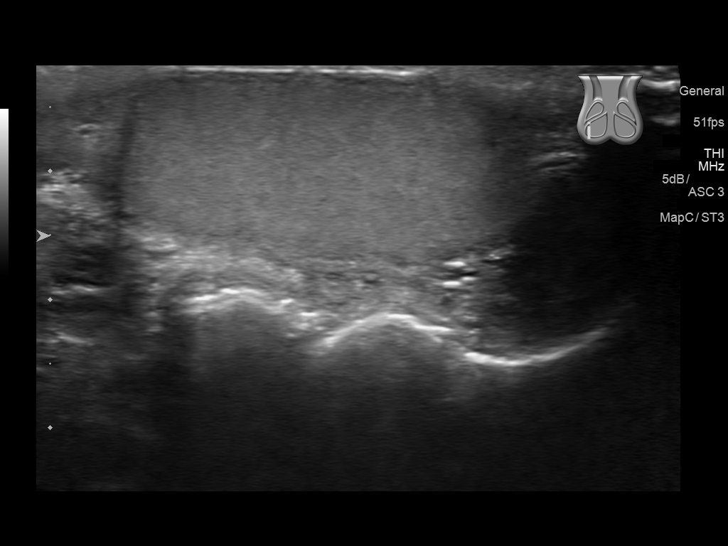
[im 7/41]
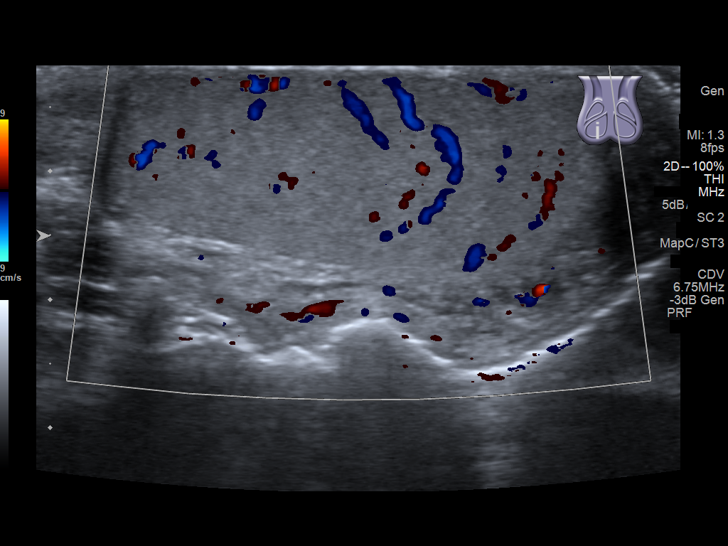
[im 11/41]
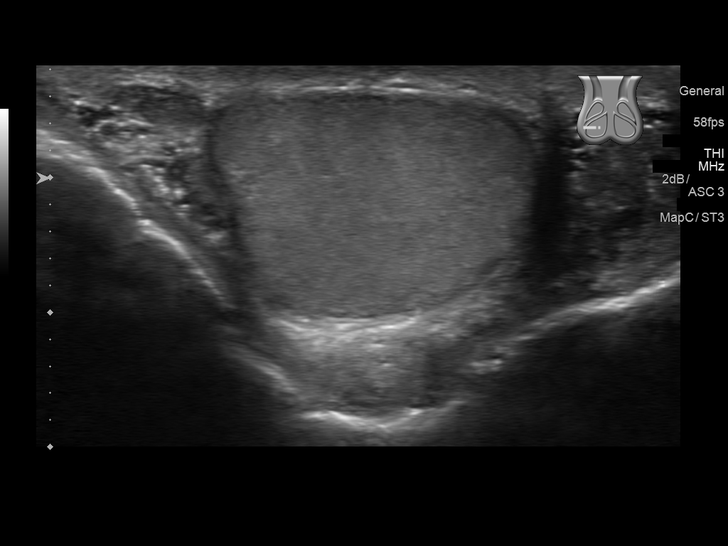
[im 14/41]
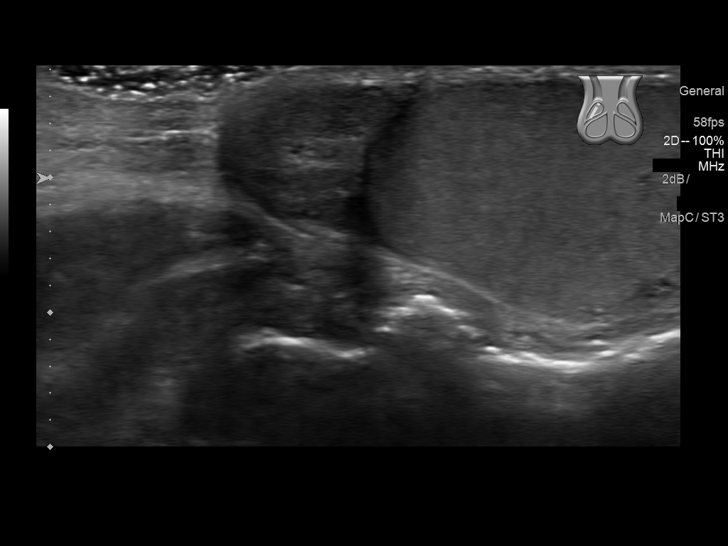
[im 16/41]
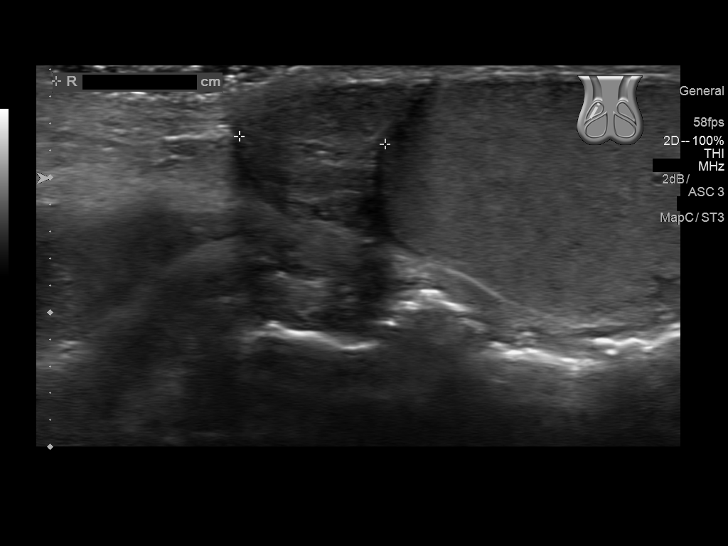
[im 19/41]
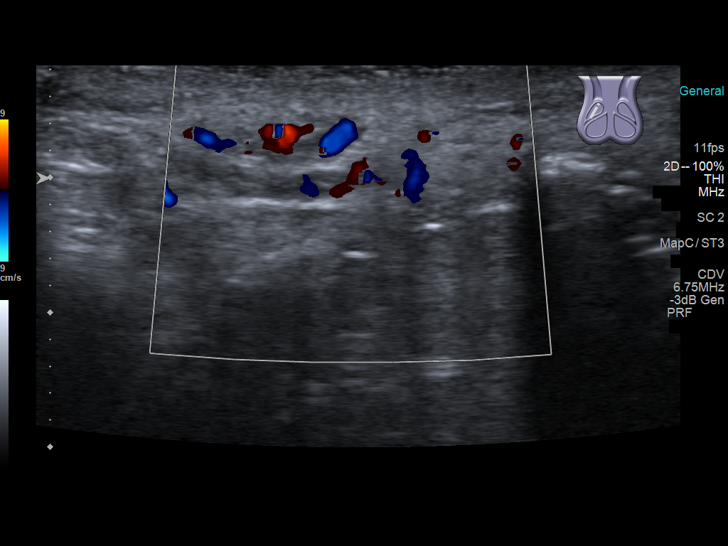
[im 22/41]
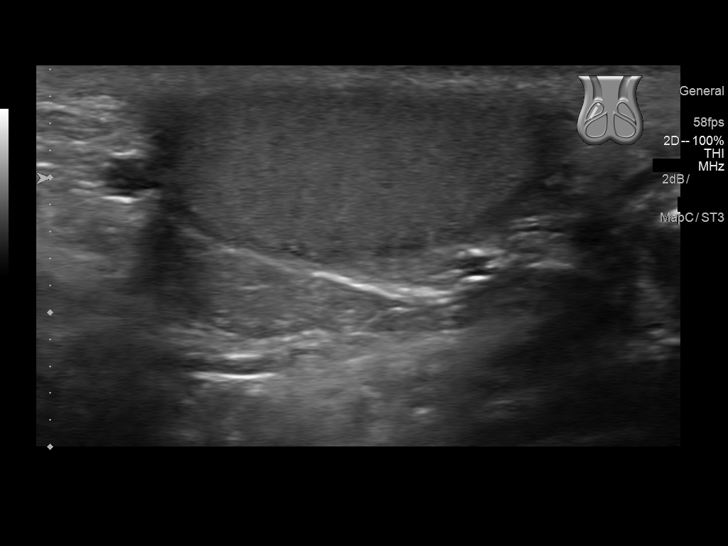
[im 26/41]
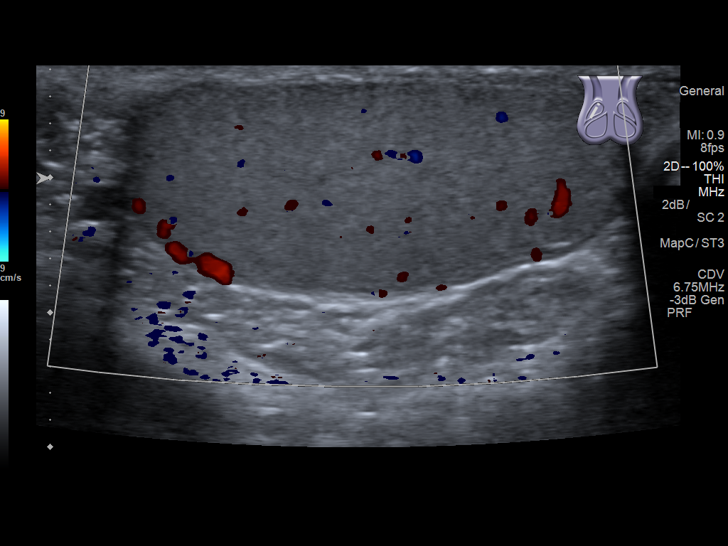
[im 27/41]
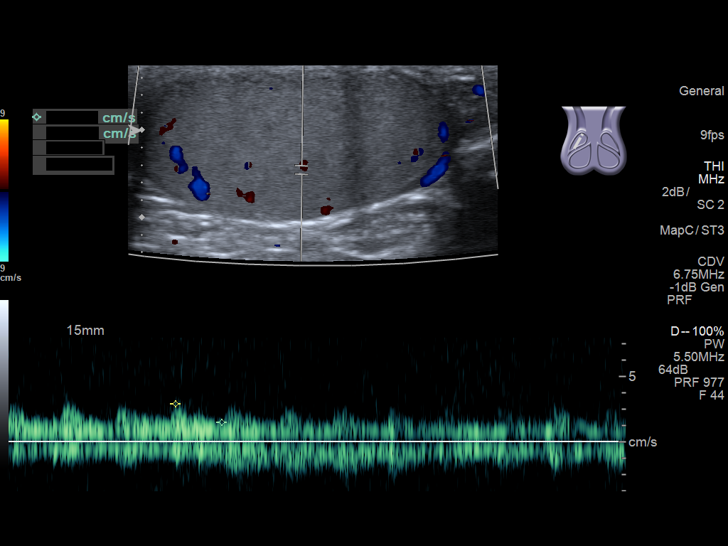
[im 31/41]
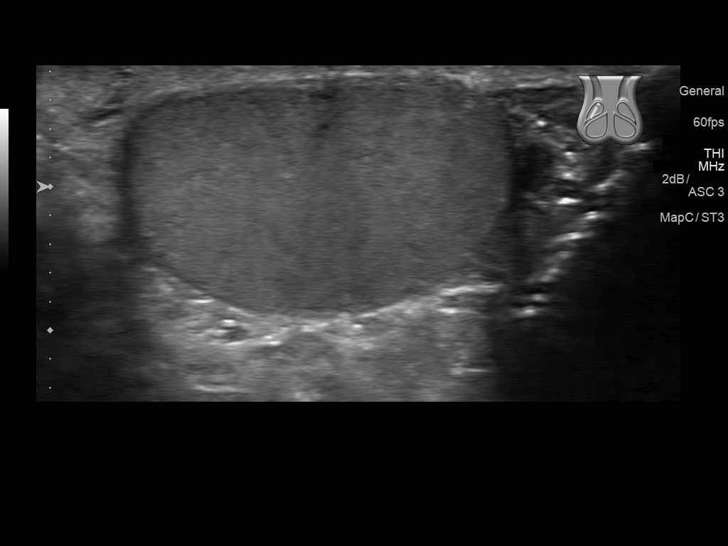
[im 34/41]
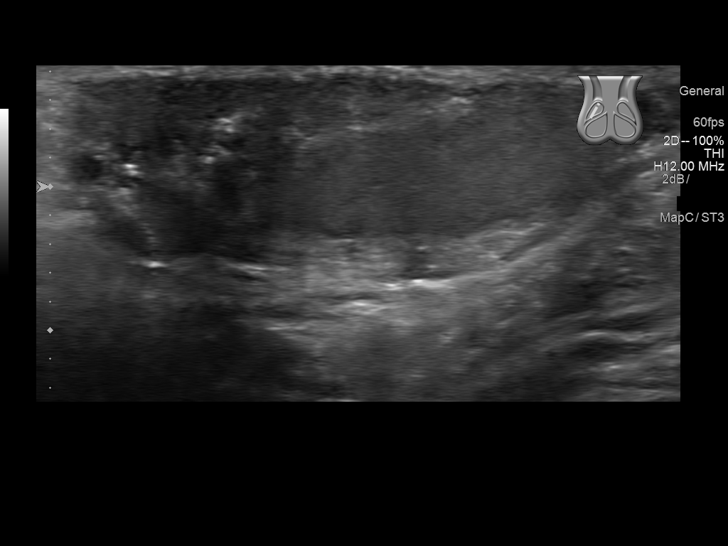
[im 37/41]
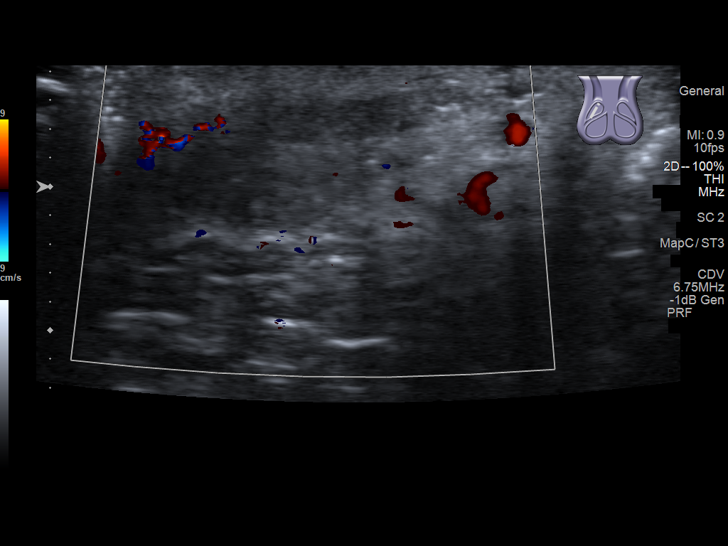
[im 41/41]
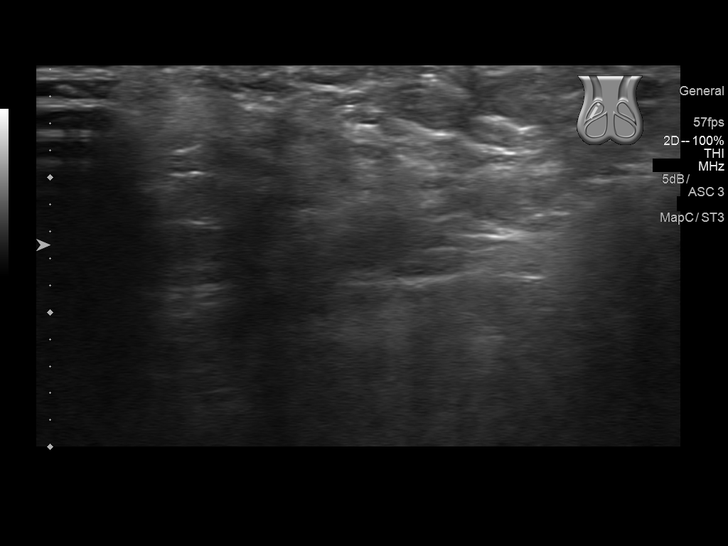

[14 of 25 positions shown; findings below may reference images not displayed]

FINDINGS: Right testicle

Measurements: 3.8 x 1.7 x 2.5 cm. No mass or microlithiasis
visualized.

Left testicle

Measurements: 3.9 x 1.6 x 2.7 cm. No mass or microlithiasis
visualized.

Right epididymis:  Normal in size and appearance.

Left epididymis:  Normal in size and appearance.

Hydrocele:  None visualized.

Varicocele:  None visualized.

Pulsed Doppler interrogation of both testes demonstrates normal low
resistance arterial and venous waveforms bilaterally.
IMPRESSION: Normal study.

## 2019-05-28 DIAGNOSIS — K2100 Gastro-esophageal reflux disease with esophagitis, without bleeding: Secondary | ICD-10-CM | POA: Diagnosis not present

## 2019-05-28 DIAGNOSIS — F419 Anxiety disorder, unspecified: Secondary | ICD-10-CM | POA: Diagnosis not present

## 2019-05-28 DIAGNOSIS — I10 Essential (primary) hypertension: Secondary | ICD-10-CM | POA: Diagnosis not present

## 2019-05-28 DIAGNOSIS — F17210 Nicotine dependence, cigarettes, uncomplicated: Secondary | ICD-10-CM | POA: Diagnosis not present

## 2019-05-28 DIAGNOSIS — R0789 Other chest pain: Secondary | ICD-10-CM | POA: Diagnosis not present

## 2019-05-28 DIAGNOSIS — R079 Chest pain, unspecified: Secondary | ICD-10-CM | POA: Diagnosis not present

## 2019-05-28 DIAGNOSIS — Z79899 Other long term (current) drug therapy: Secondary | ICD-10-CM | POA: Diagnosis not present

## 2019-05-28 DIAGNOSIS — R202 Paresthesia of skin: Secondary | ICD-10-CM | POA: Diagnosis not present
